# Patient Record
Sex: Female | Born: 2005 | Race: White | Hispanic: No | Marital: Single | Smoking: Current every day smoker
Health system: Southern US, Community
[De-identification: ages and names within clinical notes are randomized; demographics above are authoritative.]

## PROBLEM LIST (undated history)

## (undated) DIAGNOSIS — T7840XA Allergy, unspecified, initial encounter: Secondary | ICD-10-CM

## (undated) DIAGNOSIS — F329 Major depressive disorder, single episode, unspecified: Secondary | ICD-10-CM

## (undated) DIAGNOSIS — F32A Depression, unspecified: Secondary | ICD-10-CM

## (undated) DIAGNOSIS — E739 Lactose intolerance, unspecified: Secondary | ICD-10-CM

## (undated) DIAGNOSIS — F419 Anxiety disorder, unspecified: Secondary | ICD-10-CM

## (undated) DIAGNOSIS — F909 Attention-deficit hyperactivity disorder, unspecified type: Secondary | ICD-10-CM

---
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
## 2024-01-06 ENCOUNTER — Encounter (HOSPITAL_COMMUNITY): Payer: Self-pay

## 2024-01-06 ENCOUNTER — Emergency Department (HOSPITAL_COMMUNITY)
Admission: EM | Admit: 2024-01-06 | Discharge: 2024-01-06 | Disposition: A | Discharge status: Home / Self Care | Payer: MEDICAID | Attending: Emergency Medicine | Admitting: Emergency Medicine

## 2024-01-06 ENCOUNTER — Emergency Department (HOSPITAL_COMMUNITY): Payer: MEDICAID

## 2024-01-06 ENCOUNTER — Other Ambulatory Visit: Payer: Self-pay

## 2024-01-06 DIAGNOSIS — R0602 Shortness of breath: Secondary | ICD-10-CM | POA: Insufficient documentation

## 2024-01-06 DIAGNOSIS — R6884 Jaw pain: Secondary | ICD-10-CM | POA: Diagnosis present

## 2024-01-06 DIAGNOSIS — K047 Periapical abscess without sinus: Secondary | ICD-10-CM | POA: Insufficient documentation

## 2024-01-06 LAB — COMPREHENSIVE METABOLIC PANEL WITH GFR
ALT: 14 U/L (ref 0–44)
AST: 23 U/L (ref 15–41)
Albumin: 4.5 g/dL (ref 3.5–5.0)
Alkaline Phosphatase: 52 U/L (ref 38–126)
Anion gap: 12 (ref 5–15)
BUN: 7 mg/dL (ref 6–20)
CO2: 23 mmol/L (ref 22–32)
Calcium: 9.6 mg/dL (ref 8.9–10.3)
Chloride: 103 mmol/L (ref 98–111)
Creatinine, Ser: 0.72 mg/dL (ref 0.44–1.00)
GFR, Estimated: >60 mL/min (ref >60)
Glucose, Bld: 111 mg/dL — ABNORMAL HIGH (ref 70–99)
Potassium: 3.7 mmol/L (ref 3.5–5.1)
Sodium: 138 mmol/L (ref 135–145)
Total Bilirubin: 0.3 mg/dL (ref 0.0–1.2)
Total Protein: 7.4 g/dL (ref 6.5–8.1)

## 2024-01-06 LAB — CBC WITH DIFFERENTIAL/PLATELET
Abs Immature Granulocytes: 0.02 K/uL (ref 0.00–0.07)
Basophils Absolute: 0.1 K/uL (ref 0.0–0.1)
Basophils Relative: 1 %
Eosinophils Absolute: 0.1 K/uL (ref 0.0–0.5)
Eosinophils Relative: 2 %
HCT: 40.4 % (ref 36.0–46.0)
Hemoglobin: 13.7 g/dL (ref 12.0–15.0)
Immature Granulocytes: 0 %
Lymphocytes Relative: 36 %
Lymphs Abs: 2.5 K/uL (ref 0.7–4.0)
MCH: 28 pg (ref 26.0–34.0)
MCHC: 33.9 g/dL (ref 30.0–36.0)
MCV: 82.4 fL (ref 80.0–100.0)
Monocytes Absolute: 0.7 K/uL (ref 0.1–1.0)
Monocytes Relative: 9 %
Neutro Abs: 3.7 K/uL (ref 1.7–7.7)
Neutrophils Relative %: 52 %
Platelets: 257 K/uL (ref 150–400)
RBC: 4.9 MIL/uL (ref 3.87–5.11)
RDW: 12.9 % (ref 11.5–15.5)
WBC: 7.2 K/uL (ref 4.0–10.5)
nRBC: 0 % (ref 0.0–0.2)

## 2024-01-06 LAB — HCG, SERUM, QUALITATIVE: Preg, Serum: NEGATIVE

## 2024-01-06 MED ORDER — AMOXICILLIN-POT CLAVULANATE 875-125 MG PO TABS
1.0000 | ORAL_TABLET | Freq: Once | ORAL | Status: AC
  Administered 2024-01-06: 1 via ORAL
  Filled 2024-01-06: qty 1

## 2024-01-06 MED ORDER — AMOXICILLIN-POT CLAVULANATE 875-125 MG PO TABS: 1.0000 | ORAL_TABLET | Freq: Two times a day (BID) | ORAL | 0 refills | Status: AC
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
## 2024-01-06 NOTE — ED Triage Notes (Signed)
 Pt arrived from home via POV c/o jaw pain 10/10 that began approx a week ago. Pt also states that she is SOB.

## 2024-01-06 NOTE — Discharge Instructions (Addendum)

## 2024-01-08 ENCOUNTER — Telehealth (HOSPITAL_COMMUNITY): Payer: Self-pay

## 2024-01-08 NOTE — Telephone Encounter (Signed)
 Pt. Called an left a VM for a return call.  Attempted to contact pt. No answer, HIPAA compliant message left for  a return call if needed.

## 2024-01-24 ENCOUNTER — Other Ambulatory Visit: Payer: Self-pay

## 2024-01-24 ENCOUNTER — Ambulatory Visit (HOSPITAL_COMMUNITY)
Admission: EM | Admit: 2024-01-24 | Discharge: 2024-01-24 | Disposition: A | Discharge status: Other Acute Inpatient Hospital | Payer: MEDICAID | Attending: Psychiatry | Admitting: Psychiatry

## 2024-01-24 ENCOUNTER — Encounter (HOSPITAL_COMMUNITY): Payer: Self-pay

## 2024-01-24 ENCOUNTER — Encounter (HOSPITAL_COMMUNITY): Payer: Self-pay | Admitting: *Deleted

## 2024-01-24 ENCOUNTER — Inpatient Hospital Stay (HOSPITAL_COMMUNITY)
Admission: AD | Admit: 2024-01-24 | Discharge: 2024-01-28 | DRG: 883 | Disposition: A | Discharge status: Home / Self Care | Payer: MEDICAID | Source: Intra-hospital | Attending: Psychiatry | Admitting: Psychiatry

## 2024-01-24 DIAGNOSIS — F419 Anxiety disorder, unspecified: Secondary | ICD-10-CM | POA: Insufficient documentation

## 2024-01-24 DIAGNOSIS — F603 Borderline personality disorder: Secondary | ICD-10-CM

## 2024-01-24 DIAGNOSIS — F1210 Cannabis abuse, uncomplicated: Secondary | ICD-10-CM | POA: Insufficient documentation

## 2024-01-24 DIAGNOSIS — Z5982 Transportation insecurity: Secondary | ICD-10-CM | POA: Diagnosis not present

## 2024-01-24 DIAGNOSIS — F29 Unspecified psychosis not due to a substance or known physiological condition: Secondary | ICD-10-CM | POA: Insufficient documentation

## 2024-01-24 DIAGNOSIS — Z9152 Personal history of nonsuicidal self-harm: Secondary | ICD-10-CM | POA: Diagnosis not present

## 2024-01-24 DIAGNOSIS — Z8261 Family history of arthritis: Secondary | ICD-10-CM | POA: Diagnosis not present

## 2024-01-24 DIAGNOSIS — Z818 Family history of other mental and behavioral disorders: Secondary | ICD-10-CM

## 2024-01-24 DIAGNOSIS — F17210 Nicotine dependence, cigarettes, uncomplicated: Secondary | ICD-10-CM | POA: Diagnosis present

## 2024-01-24 DIAGNOSIS — E739 Lactose intolerance, unspecified: Secondary | ICD-10-CM | POA: Diagnosis present

## 2024-01-24 DIAGNOSIS — F259 Schizoaffective disorder, unspecified: Secondary | ICD-10-CM | POA: Insufficient documentation

## 2024-01-24 DIAGNOSIS — Z62810 Personal history of physical and sexual abuse in childhood: Secondary | ICD-10-CM | POA: Diagnosis not present

## 2024-01-24 DIAGNOSIS — R45850 Homicidal ideations: Secondary | ICD-10-CM | POA: Insufficient documentation

## 2024-01-24 DIAGNOSIS — F22 Delusional disorders: Secondary | ICD-10-CM | POA: Diagnosis not present

## 2024-01-24 DIAGNOSIS — F333 Major depressive disorder, recurrent, severe with psychotic symptoms: Secondary | ICD-10-CM | POA: Diagnosis present

## 2024-01-24 DIAGNOSIS — Z5986 Financial insecurity: Secondary | ICD-10-CM | POA: Diagnosis not present

## 2024-01-24 DIAGNOSIS — Z79899 Other long term (current) drug therapy: Secondary | ICD-10-CM

## 2024-01-24 DIAGNOSIS — Z5941 Food insecurity: Secondary | ICD-10-CM

## 2024-01-24 DIAGNOSIS — Z91013 Allergy to seafood: Secondary | ICD-10-CM

## 2024-01-24 DIAGNOSIS — F4481 Dissociative identity disorder: Secondary | ICD-10-CM | POA: Insufficient documentation

## 2024-01-24 DIAGNOSIS — F640 Transsexualism: Secondary | ICD-10-CM | POA: Insufficient documentation

## 2024-01-24 DIAGNOSIS — Q796 Ehlers-Danlos syndrome, unspecified: Secondary | ICD-10-CM

## 2024-01-24 DIAGNOSIS — Z6221 Child in welfare custody: Secondary | ICD-10-CM

## 2024-01-24 DIAGNOSIS — F909 Attention-deficit hyperactivity disorder, unspecified type: Secondary | ICD-10-CM | POA: Insufficient documentation

## 2024-01-24 DIAGNOSIS — F411 Generalized anxiety disorder: Secondary | ICD-10-CM | POA: Diagnosis present

## 2024-01-24 DIAGNOSIS — R45851 Suicidal ideations: Secondary | ICD-10-CM | POA: Diagnosis present

## 2024-01-24 DIAGNOSIS — Z8269 Family history of other diseases of the musculoskeletal system and connective tissue: Secondary | ICD-10-CM

## 2024-01-24 DIAGNOSIS — F4310 Post-traumatic stress disorder, unspecified: Secondary | ICD-10-CM | POA: Insufficient documentation

## 2024-01-24 DIAGNOSIS — F332 Major depressive disorder, recurrent severe without psychotic features: Secondary | ICD-10-CM | POA: Diagnosis not present

## 2024-01-24 DIAGNOSIS — F429 Obsessive-compulsive disorder, unspecified: Secondary | ICD-10-CM | POA: Insufficient documentation

## 2024-01-24 DIAGNOSIS — R9431 Abnormal electrocardiogram [ECG] [EKG]: Secondary | ICD-10-CM | POA: Insufficient documentation

## 2024-01-24 DIAGNOSIS — I4510 Unspecified right bundle-branch block: Secondary | ICD-10-CM | POA: Diagnosis present

## 2024-01-24 DIAGNOSIS — G4700 Insomnia, unspecified: Secondary | ICD-10-CM | POA: Diagnosis present

## 2024-01-24 DIAGNOSIS — Z9151 Personal history of suicidal behavior: Secondary | ICD-10-CM | POA: Diagnosis not present

## 2024-01-24 DIAGNOSIS — F1910 Other psychoactive substance abuse, uncomplicated: Secondary | ICD-10-CM

## 2024-01-24 DIAGNOSIS — F329 Major depressive disorder, single episode, unspecified: Principal | ICD-10-CM | POA: Diagnosis present

## 2024-01-24 DIAGNOSIS — Z91148 Patient's other noncompliance with medication regimen for other reason: Secondary | ICD-10-CM

## 2024-01-24 LAB — TSH: TSH: 1.959 u[IU]/mL (ref 0.350–4.500)

## 2024-01-24 LAB — CBC WITH DIFFERENTIAL/PLATELET
Abs Immature Granulocytes: 0.02 K/uL (ref 0.00–0.07)
Basophils Absolute: 0.1 K/uL (ref 0.0–0.1)
Basophils Relative: 1 %
Eosinophils Absolute: 0 K/uL (ref 0.0–0.5)
Eosinophils Relative: 0 %
HCT: 39.5 % (ref 36.0–46.0)
Hemoglobin: 13 g/dL (ref 12.0–15.0)
Immature Granulocytes: 0 %
Lymphocytes Relative: 25 %
Lymphs Abs: 1.9 K/uL (ref 0.7–4.0)
MCH: 27.7 pg (ref 26.0–34.0)
MCHC: 32.9 g/dL (ref 30.0–36.0)
MCV: 84 fL (ref 80.0–100.0)
Monocytes Absolute: 0.5 K/uL (ref 0.1–1.0)
Monocytes Relative: 7 %
Neutro Abs: 5.1 K/uL (ref 1.7–7.7)
Neutrophils Relative %: 67 %
Platelets: 213 K/uL (ref 150–400)
RBC: 4.7 MIL/uL (ref 3.87–5.11)
RDW: 12.7 % (ref 11.5–15.5)
WBC: 7.7 K/uL (ref 4.0–10.5)
nRBC: 0 % (ref 0.0–0.2)

## 2024-01-24 LAB — POCT URINE DRUG SCREEN - MANUAL ENTRY (I-SCREEN)
POC Amphetamine UR: NOT DETECTED
POC Buprenorphine (BUP): NOT DETECTED
POC Cocaine UR: NOT DETECTED
POC Marijuana UR: NOT DETECTED
POC Methadone UR: NOT DETECTED
POC Methamphetamine UR: NOT DETECTED
POC Morphine: NOT DETECTED
POC Oxazepam (BZO): NOT DETECTED
POC Oxycodone UR: NOT DETECTED
POC Secobarbital (BAR): NOT DETECTED

## 2024-01-24 LAB — LIPID PANEL
Cholesterol: 144 mg/dL (ref 0–169)
HDL: 59 mg/dL (ref >40)
LDL Cholesterol: 73 mg/dL (ref 0–99)
Total CHOL/HDL Ratio: 2.4 ratio
Triglycerides: 58 mg/dL (ref <150)
VLDL: 12 mg/dL (ref 0–40)

## 2024-01-24 LAB — COMPREHENSIVE METABOLIC PANEL WITH GFR
ALT: 14 U/L (ref 0–44)
AST: 23 U/L (ref 15–41)
Albumin: 4.5 g/dL (ref 3.5–5.0)
Alkaline Phosphatase: 59 U/L (ref 38–126)
Anion gap: 10 (ref 5–15)
BUN: 12 mg/dL (ref 6–20)
CO2: 26 mmol/L (ref 22–32)
Calcium: 9.8 mg/dL (ref 8.9–10.3)
Chloride: 104 mmol/L (ref 98–111)
Creatinine, Ser: 0.68 mg/dL (ref 0.44–1.00)
GFR, Estimated: >60 mL/min (ref >60)
Glucose, Bld: 87 mg/dL (ref 70–99)
Potassium: 3.8 mmol/L (ref 3.5–5.1)
Sodium: 140 mmol/L (ref 135–145)
Total Bilirubin: 0.5 mg/dL (ref 0.0–1.2)
Total Protein: 7.3 g/dL (ref 6.5–8.1)

## 2024-01-24 LAB — HEMOGLOBIN A1C
Hgb A1c MFr Bld: 4.6 % — ABNORMAL LOW (ref 4.8–5.6)
Mean Plasma Glucose: 85.32 mg/dL

## 2024-01-24 LAB — POC URINE PREG, ED: Preg Test, Ur: NEGATIVE

## 2024-01-24 MED ORDER — HALOPERIDOL LACTATE 5 MG/ML IJ SOLN: 10.0000 mg | Freq: Three times a day (TID) | INTRAMUSCULAR | Status: DC | PRN

## 2024-01-24 MED ORDER — LORAZEPAM 2 MG/ML IJ SOLN: 2.0000 mg | Freq: Three times a day (TID) | INTRAMUSCULAR | Status: DC | PRN

## 2024-01-24 MED ORDER — ACETAMINOPHEN 325 MG PO TABS: 650.0000 mg | ORAL_TABLET | Freq: Four times a day (QID) | ORAL | Status: DC | PRN

## 2024-01-24 MED ORDER — DIPHENHYDRAMINE HCL 50 MG/ML IJ SOLN: 50.0000 mg | Freq: Three times a day (TID) | INTRAMUSCULAR | Status: DC | PRN

## 2024-01-24 MED ORDER — HYDROXYZINE HCL 25 MG PO TABS: 25.0000 mg | ORAL_TABLET | Freq: Three times a day (TID) | ORAL | Status: DC | PRN

## 2024-01-24 MED ORDER — HALOPERIDOL LACTATE 5 MG/ML IJ SOLN: 5.0000 mg | Freq: Three times a day (TID) | INTRAMUSCULAR | Status: DC | PRN

## 2024-01-24 MED ORDER — HALOPERIDOL 5 MG PO TABS: 5.0000 mg | ORAL_TABLET | Freq: Three times a day (TID) | ORAL | Status: DC | PRN

## 2024-01-24 MED ORDER — TRAZODONE HCL 50 MG PO TABS: 50.0000 mg | ORAL_TABLET | Freq: Every evening | ORAL | Status: DC | PRN

## 2024-01-24 MED ORDER — TRAZODONE HCL 50 MG PO TABS
50.0000 mg | ORAL_TABLET | Freq: Every evening | ORAL | Status: DC | PRN
  Administered 2024-01-24: 50 mg via ORAL
  Filled 2024-01-24: qty 1

## 2024-01-24 MED ORDER — MAGNESIUM HYDROXIDE 400 MG/5ML PO SUSP: 30.0000 mL | Freq: Every day | ORAL | Status: DC | PRN

## 2024-01-24 MED ORDER — ALUM & MAG HYDROXIDE-SIMETH 200-200-20 MG/5ML PO SUSP: 30.0000 mL | ORAL | Status: DC | PRN

## 2024-01-24 MED ORDER — OLANZAPINE 5 MG PO TBDP: 5.0000 mg | ORAL_TABLET | Freq: Three times a day (TID) | ORAL | Status: DC | PRN

## 2024-01-24 MED ORDER — DIPHENHYDRAMINE HCL 50 MG PO CAPS: 50.0000 mg | ORAL_CAPSULE | Freq: Three times a day (TID) | ORAL | Status: DC | PRN

## 2024-01-24 MED ORDER — OLANZAPINE 5 MG PO TABS
5.0000 mg | ORAL_TABLET | Freq: Every day | ORAL | Status: DC
  Administered 2024-01-24: 5 mg via ORAL
  Filled 2024-01-24: qty 1

## 2024-01-24 NOTE — ED Notes (Signed)
 Pt signed e consent
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
## 2024-01-24 NOTE — ED Provider Notes (Signed)
 [facility] Continuous Assessment Admission H&P  Date: 01/24/24 Patient Name: Evelyn Owens MRN: [id number] Chief Complaint:  I was thinking suicide is the best option.   Diagnoses:  Final diagnoses:  Psychosis, unspecified psychosis type (HCC)  Dissociative identity disorder (HCC)  Suicidal ideation  Homicidal ideation  Substance abuse (HCC)  Schizoaffective disorder, unspecified type (HCC)    HPI: Evelyn Owens is an 18 year old female, who identifies as they/them pronouns, with a past psychiatric history significant for MDD with psychotic features, dissociative identity disorder, gender dysphoria, ADHD and anxiety who presented voluntarily to the [facility] accompanied by GPD with suicidal ideations with a plan, HI and bizarre behaviors.   Patient was seen face to face by this provider and chart reviewed.  Patient presents as very inattentive and disorganized with rapid speech, racing thoughts and bizarre mannerisms. Speech is pressured, tangential and mostly nonsensical. Pt presents as very anxious and restless. Patient is observed muttering under her breath at times. She has difficulty focusing and is difficult to understand.  Patient was asked about the circumstances that led to her [facility] visit today and she reports I was about to take myself out and take them out, I was f........g mad, I was thinking suicide is the best option, stab myself, shoot myself, I have no clue how I got here.  Patient reports she smokes marijuana.  She lives with her dad and she is trying to get back in school. Patient endorses voices in my head but not out.  She reports the voices are telling her to relax, chill, this is normal, she denies command hallucinations.   Unable to continue further evaluation at this time due to patient's mental status.   On evaluation, patient is alert, oriented x 1, and cooperative. Speech is pressured and increased. Pt appear casually dressed. Eye  contact is poor. Mood is anxious, affect is congruent with mood. Thought process is disorganized, tangential and thought content is scattered. Pt endorses SI,and HI, denies AVH. There is no objective indication at this time, that the patient is responding to internal stimuli. No delusions elicited during this assessment.    Discussed recommendation for inpatient psychiatric admission for stabilization and treatment.  Patient is provided with opportunity for questions.  She verbalized understanding and is in agreement.  Total Time spent with patient: 30 minutes  Musculoskeletal  Strength & Muscle Tone: within normal limits Gait & Station: normal Patient leans: N/A  Psychiatric Specialty Exam  Presentation General Appearance:  Casual  Eye Contact: Poor  Speech: Pressured  Speech Volume: Increased  Handedness: Right   Mood and Affect  Mood: Anxious  Affect: Congruent   Thought Process  Thought Processes: Disorganized  Descriptions of Associations:Tangential  Orientation:Other (comment) (UTA)  Thought Content:Scattered; Tangential  Diagnosis of Schizophrenia or Schizoaffective disorder in past: No  Duration of Psychotic Symptoms: Less than six months  Hallucinations:Hallucinations: Auditory Description of Auditory Hallucinations: Pt reports voices in my head, talking constantly.  Ideas of Reference:None  Suicidal Thoughts:Suicidal Thoughts: Yes, Active SI Active Intent and/or Plan: With Plan  Homicidal Thoughts:Homicidal Thoughts: Yes, Passive HI Passive Intent and/or Plan: Without Plan   Sensorium  Memory: Immediate Poor  Judgment: Poor  Insight: Lacking   Executive Functions  Concentration: Poor  Attention Span: Poor  Recall: Poor  Fund of Knowledge: Poor  Language: Poor   Psychomotor Activity  Psychomotor Activity: Psychomotor Activity: Mannerisms   Assets  Assets: Communication Skills; Desire for Improvement   Sleep   Sleep: Sleep: Poor  Nutritional Assessment (For OBS and [facility] admissions only) Has the patient had a weight loss or gain of 10 pounds or more in the last 3 months?: No Has the patient had a decrease in food intake/or appetite?: No Does the patient have dental problems?: No Does the patient have eating habits or behaviors that may be indicators of an eating disorder including binging or inducing vomiting?: No Has the patient recently lost weight without trying?: 0 Has the patient been eating poorly because of a decreased appetite?: 0 Malnutrition Screening Tool Score: 0    Physical Exam Constitutional:      Appearance: She is not ill-appearing or diaphoretic.  HENT:     Nose: No congestion.  Pulmonary:     Effort: No respiratory distress.  Neurological:     General: No focal deficit present.     Mental Status: She is alert.  Psychiatric:        Attention and Perception: She is inattentive. She perceives auditory hallucinations.        Mood and Affect: Mood is anxious. Affect is inappropriate.        Speech: Speech is rapid and pressured.        Behavior: Behavior is actively hallucinating. Behavior is cooperative.        Thought Content: Thought content includes homicidal and suicidal ideation. Thought content includes suicidal plan.    Review of Systems  Constitutional:  Negative for chills, diaphoresis and fever.  HENT:  Negative for congestion.   Eyes:  Negative for discharge.  Respiratory:  Negative for cough, shortness of breath and wheezing.   Cardiovascular:  Negative for chest pain and palpitations.  Gastrointestinal:  Negative for diarrhea, nausea and vomiting.  Neurological:  Negative for dizziness, seizures, weakness and headaches.  Psychiatric/Behavioral:  Positive for hallucinations and suicidal ideas. The patient is nervous/anxious.     Blood pressure (!) 125/93, pulse (!) 101, temperature 99.8 F (37.7 C), temperature source Oral, resp. rate 20, last  menstrual period 01/06/2024, SpO2 97%. There is no height or weight on file to calculate BMI.  Past Psychiatric History: See H & P   Is the patient at risk to self? Yes  Has the patient been a risk to self in the past 6 months? Yes .    Has the patient been a risk to self within the distant past? Yes   Is the patient a risk to others? Yes   Has the patient been a risk to others in the past 6 months? unknown  Has the patient been a risk to others within the distant past? unknown  Past Medical History: See Chart  Family History: N/A  Social History: N/A  Last Labs:  Admission on 01/24/2024  Component Date Value Ref Range Status   Preg Test, Ur 01/24/2024 Negative  Negative Final   POC Amphetamine UR 01/24/2024 None Detected  NONE DETECTED (Cut Off Level 1000 ng/mL) Final   POC Secobarbital (BAR) 01/24/2024 None Detected  NONE DETECTED (Cut Off Level 300 ng/mL) Final   POC Buprenorphine (BUP) 01/24/2024 None Detected  NONE DETECTED (Cut Off Level 10 ng/mL) Final   POC Oxazepam (BZO) 01/24/2024 None Detected  NONE DETECTED (Cut Off Level 300 ng/mL) Final   POC Cocaine UR 01/24/2024 None Detected  NONE DETECTED (Cut Off Level 300 ng/mL) Final   POC Methamphetamine UR 01/24/2024 None Detected  NONE DETECTED (Cut Off Level 1000 ng/mL) Final   POC Morphine 01/24/2024 None Detected  NONE DETECTED (Cut Off Level 300  ng/mL) Final   POC Methadone UR 01/24/2024 None Detected  NONE DETECTED (Cut Off Level 300 ng/mL) Final   POC Oxycodone UR 01/24/2024 None Detected  NONE DETECTED (Cut Off Level 100 ng/mL) Final   POC Marijuana UR 01/24/2024 None Detected  NONE DETECTED (Cut Off Level 50 ng/mL) Final  Admission on 01/06/2024, Discharged on 01/06/2024  Component Date Value Ref Range Status   Preg, Serum 01/06/2024 NEGATIVE  NEGATIVE Final   Comment:        THE SENSITIVITY OF THIS METHODOLOGY IS >10 mIU/mL. Performed at [facility], [address], [place], [place] [address]    WBC  01/06/2024 7.2  4.0 - 10.5 K/uL Final   RBC 01/06/2024 4.90  3.87 - 5.11 MIL/uL Final   Hemoglobin 01/06/2024 13.7  12.0 - 15.0 g/dL Final   HCT 92/92/7974 40.4  36.0 - 46.0 % Final   MCV 01/06/2024 82.4  80.0 - 100.0 fL Final   MCH 01/06/2024 28.0  26.0 - 34.0 pg Final   MCHC 01/06/2024 33.9  30.0 - 36.0 g/dL Final   RDW 92/92/7974 12.9  11.5 - 15.5 % Final   Platelets 01/06/2024 257  150 - 400 K/uL Final   nRBC 01/06/2024 0.0  0.0 - 0.2 % Final   Neutrophils Relative % 01/06/2024 52  % Final   Neutro Abs 01/06/2024 3.7  1.7 - 7.7 K/uL Final   Lymphocytes Relative 01/06/2024 36  % Final   Lymphs Abs 01/06/2024 2.5  0.7 - 4.0 K/uL Final   Monocytes Relative 01/06/2024 9  % Final   Monocytes Absolute 01/06/2024 0.7  0.1 - 1.0 K/uL Final   Eosinophils Relative 01/06/2024 2  % Final   Eosinophils Absolute 01/06/2024 0.1  0.0 - 0.5 K/uL Final   Basophils Relative 01/06/2024 1  % Final   Basophils Absolute 01/06/2024 0.1  0.0 - 0.1 K/uL Final   Immature Granulocytes 01/06/2024 0  % Final   Abs Immature Granulocytes 01/06/2024 0.02  0.00 - 0.07 K/uL Final   Performed at [facility], [address], [place], [place] [address]   Sodium 01/06/2024 138  135 - 145 mmol/L Final   Potassium 01/06/2024 3.7  3.5 - 5.1 mmol/L Final   Chloride 01/06/2024 103  98 - 111 mmol/L Final   CO2 01/06/2024 23  22 - 32 mmol/L Final   Glucose, Bld 01/06/2024 111 (H)  70 - 99 mg/dL Final   Glucose reference range applies only to samples taken after fasting for at least 8 hours.   BUN 01/06/2024 7  6 - 20 mg/dL Final   Creatinine, Ser 01/06/2024 0.72  0.44 - 1.00 mg/dL Final   Calcium 92/92/7974 9.6  8.9 - 10.3 mg/dL Final   Total Protein 92/92/7974 7.4  6.5 - 8.1 g/dL Final   Albumin 92/92/7974 4.5  3.5 - 5.0 g/dL Final   AST 92/92/7974 23  15 - 41 U/L Final   ALT 01/06/2024 14  0 - 44 U/L Final   Alkaline Phosphatase 01/06/2024 52  38 - 126 U/L Final   Total Bilirubin 01/06/2024 0.3  0.0 - 1.2 mg/dL  Final   GFR, Estimated 01/06/2024 >60  >60 mL/min Final   Comment: (NOTE) Calculated using the CKD-EPI Creatinine Equation (2021)    Anion gap 01/06/2024 12  5 - 15 Final   Performed at [facility], [address], [place], [place] [address]  Admission on 10/08/2023, Discharged on 10/09/2023  Component Date Value Ref  Range Status   WBC 10/09/2023 8.7  4.0 - 10.5 K/uL Final   RBC 10/09/2023 4.95  3.87 - 5.11 MIL/uL Final   Hemoglobin 10/09/2023 13.9  12.0 - 15.0 g/dL Final   HCT 95/90/7974 41.8  36.0 - 46.0 % Final   MCV 10/09/2023 84.4  80.0 - 100.0 fL Final   MCH 10/09/2023 28.1  26.0 - 34.0 pg Final   MCHC 10/09/2023 33.3  30.0 - 36.0 g/dL Final   RDW 95/90/7974 12.6  11.5 - 15.5 % Final   Platelets 10/09/2023 276  150 - 400 K/uL Final   nRBC 10/09/2023 0.0  0.0 - 0.2 % Final   Performed at [facility], [address] [place], [place] [address]   Sodium 10/09/2023 139  135 - 145 mmol/L Final   Potassium 10/09/2023 4.3  3.5 - 5.1 mmol/L Final   Chloride 10/09/2023 104  98 - 111 mmol/L Final   CO2 10/09/2023 26  22 - 32 mmol/L Final   Glucose, Bld 10/09/2023 89  70 - 99 mg/dL Final   Glucose reference range applies only to samples taken after fasting for at least 8 hours.   BUN 10/09/2023 11  6 - 20 mg/dL Final   Creatinine, Ser 10/09/2023 0.76  0.44 - 1.00 mg/dL Final   Calcium 95/90/7974 9.8  8.9 - 10.3 mg/dL Final   GFR, Estimated 10/09/2023 >60  >60 mL/min Final   Comment: (NOTE) Calculated using the CKD-EPI Creatinine Equation (2021)    Anion gap 10/09/2023 9  5 - 15 Final   Performed at [facility], [address] [place], [place] [address]    Allergies: Milk-related compounds and Shellfish allergy  Medications:  Facility Ordered Medications  Medication   acetaminophen  (TYLENOL ) tablet 650 mg   alum & mag hydroxide-simeth (MAALOX/MYLANTA) 200-200-20 MG/5ML suspension 30 mL   magnesium  hydroxide (MILK OF MAGNESIA)  suspension 30 mL   haloperidol (HALDOL) tablet 5 mg   And   diphenhydrAMINE  (BENADRYL ) capsule 50 mg   haloperidol lactate (HALDOL) injection 5 mg   And   diphenhydrAMINE  (BENADRYL ) injection 50 mg   And   LORazepam  (ATIVAN ) injection 2 mg   haloperidol lactate (HALDOL) injection 10 mg   And   diphenhydrAMINE  (BENADRYL ) injection 50 mg   And   LORazepam  (ATIVAN ) injection 2 mg   hydrOXYzine  (ATARAX ) tablet 25 mg   traZODone (DESYREL) tablet 50 mg   OLANZapine  (ZYPREXA ) tablet 5 mg   PTA Medications  Medication Sig   Drospirenone  (SLYND ) 4 MG TABS Take 1 tablet (4 mg total) by mouth daily.   lidocaine  (LIDODERM ) 5 % Place 2 patches onto the skin daily. Remove & Discard patch within 12 hours or as directed by MD (Patient not taking: Reported on 10/09/2023)   sertraline  (ZOLOFT ) 50 MG tablet Take 1 tablet (50 mg total) by mouth daily.   ibuprofen  (ADVIL ) 200 MG tablet Take 200 mg by mouth every 6 (six) hours as needed for moderate pain (pain score 4-6), headache or mild pain (pain score 1-3).      Medical Decision Making  Recommend inpatient psychiatric admission for stabilization and treatment.  Patient presents as very disorganized with pressured and tangential speech and bizarre mannerisms.  She is endorsing SI with a plan and HI no plan.  She is also endorsing auditory hallucinations of several voices in her head, controlling her. Patient is a danger to herself and will benefit from inpatient psychiatric hospitalization. Patient will be admitted to  the continuous observation unit for safety monitoring pending transfer to an inpatient psychiatric unit. LCSW will seek bed placement.   Lab Orders         CBC with Differential/Platelet         Comprehensive metabolic panel         Hemoglobin A1c         Lipid panel         TSH         POC urine preg, ED         POCT Urine Drug Screen - (I-Screen)     EKG  Medication started -Zyprexa  5 mg p.o. daily at bedtime for  AH/Psychosis/Mood  Other PRN meds -Trazodone, MOM, Atarax , Maalox, Tylenol  -As needed agitation protocol medications   Recommendations  Based on my evaluation the patient does not appear to have an emergency medical condition.  Recommend inpatient psychiatric admission for stabilization and treatment.  Thurman LULLA Ivans, NP 01/24/24  3:11 AM

## 2024-01-24 NOTE — ED Notes (Signed)
 Pt woke and was hungry. Pt provided with sandwich.chips and juice.  Pt stated. Did I miss transport? Referring to  transfer to [facility].  Assured pt she did not and she will be transported when bed is ready. Pt spoke with dad briefly on phone.  Then laid back in  bed Hourly observations continue for safety

## 2024-01-24 NOTE — ED Provider Notes (Signed)
 Behavioral Health Discharge Summary  Date and Time: 01/24/2024 10:10 AM Name:  Evelyn Owens (goes by Evelyn Owens) Pronouns: they/them per chart review, any [pronouns] are fine this morning on interview MRN:  [id number]  Subjective:    Stay Summary:Evelyn Owens is an 18 year old Transgender female -> female who arrived at [facility] from home with GPD on 7/25 around 1AM voluntarily for SI w/ plan, HI w/o plan, hearing voices, pressured speech, and bizarre behaviors. Psychiatric history includes MDD, DID, Gender dysphoria, ADHD, and anxiety. Medical history includes chlamydia. No reported past psychiatric hospitalizations. In addition to voicing SI w/ plan, also made statements such as I was about to take myself out and take them out, I was f........g mad, I was thinking suicide is the best option, stab myself, shoot myself, I have no clue how I got here, voices in my head but not out. She reports the voices are telling her to relax, chill, this is normal, she denies command hallucinations. I just want know if I'm pregnant or not; I've had chlamydia 4 times now.   During their stay, they required trazodone 50mg  and zyprexa  5mg  at about 3am to sleep, and they were somnolent past 11am the same morning.  01/24/24 Initial interview conducted at 8:30 AM limited by patient participation. Patient reported having taken trazodone and Zyprexa  around 3:00 or 4:00 AM (confirmed 50 mg trazodone, 5 mg Zyprexa  03:12), so they were intermittently somnolent/sleeping. Patient expressed that they felt they were at risk to [themselves] and feeling vulnerable, prompting them to come to [facility] yesterday. They share that they would benefit from being stabilized. They described their mood as normal and neutral. On ROS, they endorsed no active AVH but history of seeing a frightening tall man and hearing overlapping voices talking without commands or distressing content. They denied SI/HI and denied present/historical  manic symptoms (described to the patient as boundless energy and elation without need for sleep for days on end). Patient endorsed being on sertraline  50 mg but it didn't work. Per chart review, patient has been on Abilify  and Zyprexa  in the past.  Collateral from father 01/24/24: Pt exerts lots of independence, drinks 3-4 energy drinks daily, and is pretty anti-drug. Says she is pretty scatter brained, but that she is highly intelligent and she will probably try to work the system to get out. Otherwise, father does not provide much supervision for her 69 year old child and did not think that her fast speech was abnormal for her. No clear psychosis/mania witnessed by father.  Follow-up interview attempted at 10:30 AM. Patient was still sleeping. They were arousable to name with light knee touch but were not arousable to name only. They immediately fell asleep during questioning.  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 schizoaffective disorder). Social History: Lives with father and aunt and describes their dynamic as interesting. Reports feeling safe at home. Enjoys drawing and listening to music. Shares that their family would describe them as caring.     Final diagnoses:  Psychosis, unspecified psychosis type [facility])  Dissociative  identity disorder (HCC)  Suicidal ideation  Homicidal ideation  Substance abuse (HCC)   Schizoaffective disorder, unspecified type (HCC)    Additional Social History:    Pain Medications: Please see MAR Prescriptions: Please see MAR Over the Counter: Please see MAR History of alcohol / drug use?: No history of alcohol / drug abuse Longest period of sobriety (when/how long): reports THC use, denies abuse Negative Consequences of Use:  (n/a) Withdrawal Symptoms:  (n/a)                    Sleep: Good  Appetite:  NA  Current Medications:  Current Facility-Administered Medications  Medication Dose Route Frequency Provider Last Rate Last Admin   acetaminophen  (TYLENOL ) tablet 650 mg  650 mg Oral Q6H PRN Onuoha, Chinwendu V, NP       alum & mag hydroxide-simeth (MAALOX/MYLANTA) 200-200-20 MG/5ML suspension 30 mL  30 mL Oral Q4H PRN Onuoha, Chinwendu V, NP       haloperidol (HALDOL) tablet 5 mg  5 mg Oral TID PRN Onuoha, Chinwendu V, NP       And   diphenhydrAMINE  (BENADRYL ) capsule 50 mg  50 mg Oral TID PRN Onuoha, Chinwendu V, NP       haloperidol lactate (HALDOL) injection 5 mg  5 mg Intramuscular TID PRN Onuoha, Chinwendu V, NP       And   diphenhydrAMINE  (BENADRYL ) injection 50 mg  50 mg Intramuscular TID PRN Onuoha, Chinwendu V, NP       And   LORazepam  (ATIVAN ) injection 2 mg  2 mg Intramuscular TID PRN Onuoha, Chinwendu V, NP       haloperidol lactate (HALDOL) injection 10 mg  10 mg Intramuscular TID PRN Onuoha, Chinwendu V, NP       And   diphenhydrAMINE  (BENADRYL ) injection 50 mg  50 mg Intramuscular TID PRN Onuoha, Chinwendu V, NP       And   LORazepam  (ATIVAN ) injection 2 mg  2 mg Intramuscular TID PRN Onuoha, Chinwendu V, NP       hydrOXYzine  (ATARAX ) tablet 25 mg  25 mg Oral TID PRN Onuoha, Chinwendu V, NP       magnesium  hydroxide (MILK OF MAGNESIA) suspension 30 mL  30 mL Oral Daily PRN Onuoha, Chinwendu V, NP       OLANZapine  (ZYPREXA ) tablet 5 mg  5 mg Oral QHS Onuoha, Chinwendu V, NP   5 mg at 01/24/24 0312   traZODone (DESYREL) tablet 50 mg   50 mg Oral QHS PRN Onuoha, Chinwendu V, NP   50 mg at 01/24/24 9687   Current Outpatient Medications  Medication Sig Dispense Refill   Drospirenone  (SLYND ) 4 MG TABS Take 1 tablet (4 mg total) by mouth daily. 90 tablet 3   ibuprofen  (ADVIL ) 200 MG tablet Take 200 mg by mouth every 6 (six) hours as needed for moderate pain (pain score 4-6), headache or mild pain (pain score 1-3).     lidocaine  (LIDODERM ) 5 % Place 2 patches onto the skin daily. Remove & Discard patch within 12 hours or as directed by MD (Patient not taking: Reported on 10/09/2023) 60 patch 0   sertraline  (ZOLOFT ) 50 MG tablet Take 1 tablet (50 mg total) by mouth daily. 30 tablet 1    Labs  Lab Results:  Admission on 01/24/2024  Component Date Value Ref Range Status   WBC 01/24/2024 7.7  4.0 - 10.5 K/uL Final   RBC 01/24/2024 4.70  3.87 - 5.11 MIL/uL Final  Hemoglobin 01/24/2024 13.0  12.0 - 15.0 g/dL Final   HCT 92/74/7974 39.5  36.0 - 46.0 % Final   MCV 01/24/2024 84.0  80.0 - 100.0 fL Final   MCH 01/24/2024 27.7  26.0 - 34.0 pg Final   MCHC 01/24/2024 32.9  30.0 - 36.0 g/dL Final   RDW 92/74/7974 12.7  11.5 - 15.5 % Final   Platelets 01/24/2024 213  150 - 400 K/uL Final   nRBC 01/24/2024 0.0  0.0 - 0.2 % Final   Neutrophils Relative % 01/24/2024 67  % Final   Neutro Abs 01/24/2024 5.1  1.7 - 7.7 K/uL Final   Lymphocytes Relative 01/24/2024 25  % Final   Lymphs Abs 01/24/2024 1.9  0.7 - 4.0 K/uL Final   Monocytes Relative 01/24/2024 7  % Final   Monocytes Absolute 01/24/2024 0.5  0.1 - 1.0 K/uL Final   Eosinophils Relative 01/24/2024 0  % Final   Eosinophils Absolute 01/24/2024 0.0  0.0 - 0.5 K/uL Final   Basophils Relative 01/24/2024 1  % Final   Basophils Absolute 01/24/2024 0.1  0.0 - 0.1 K/uL Final   Immature Granulocytes 01/24/2024 0  % Final   Abs Immature Granulocytes 01/24/2024 0.02  0.00 - 0.07 K/uL Final   Performed at [facility], [address] [place], [place] [address]   Sodium 01/24/2024 140   135 - 145 mmol/L Final   Potassium 01/24/2024 3.8  3.5 - 5.1 mmol/L Final   Chloride 01/24/2024 104  98 - 111 mmol/L Final   CO2 01/24/2024 26  22 - 32 mmol/L Final   Glucose, Bld 01/24/2024 87  70 - 99 mg/dL Final   Glucose reference range applies only to samples taken after fasting for at least 8 hours.   BUN 01/24/2024 12  6 - 20 mg/dL Final   Creatinine, Ser 01/24/2024 0.68  0.44 - 1.00 mg/dL Final   Calcium 92/74/7974 9.8  8.9 - 10.3 mg/dL Final   Total Protein 92/74/7974 7.3  6.5 - 8.1 g/dL Final   Albumin 92/74/7974 4.5  3.5 - 5.0 g/dL Final   AST 92/74/7974 23  15 - 41 U/L Final   ALT 01/24/2024 14  0 - 44 U/L Final   Alkaline Phosphatase 01/24/2024 59  38 - 126 U/L Final   Total Bilirubin 01/24/2024 0.5  0.0 - 1.2 mg/dL Final   GFR, Estimated 01/24/2024 >60  >60 mL/min Final   Comment: (NOTE) Calculated using the CKD-EPI Creatinine Equation (2021)    Anion gap 01/24/2024 10  5 - 15 Final   Performed at [facility], [address], [place], [place] [address]   Hgb A1c MFr Bld 01/24/2024 4.6 (L)  4.8 - 5.6 % Final   Comment: (NOTE) Diagnosis of Diabetes The following HbA1c ranges recommended by the American Diabetes Association (ADA) may be used as an aid in the diagnosis of diabetes mellitus.  Hemoglobin             Suggested A1C NGSP%              Diagnosis  <5.7                   Non Diabetic  5.7-6.4                Pre-Diabetic  >6.4                   Diabetic  <7.0  Glycemic control for                       adults with diabetes.     Mean Plasma Glucose 01/24/2024 85.32  mg/dL Final   Performed at [facility], [address] [place], [place] [address]   Cholesterol 01/24/2024 144  0 - 169 mg/dL Final   Triglycerides 92/74/7974 58  <150 mg/dL Final   HDL 92/74/7974 59  >40 mg/dL Final   Total CHOL/HDL Ratio 01/24/2024 2.4  RATIO Final   VLDL 01/24/2024 12  0 - 40 mg/dL Final   LDL Cholesterol 01/24/2024 73  0 - 99 mg/dL Final    Comment:        Total Cholesterol/HDL:CHD Risk Coronary Heart Disease Risk Table                     Men   Women  1/2 Average Risk   3.4   3.3  Average Risk       5.0   4.4  2 X Average Risk   9.6   7.1  3 X Average Risk  23.4   11.0        Use the calculated Patient Ratio above and the CHD Risk Table to determine the patient's CHD Risk.        ATP III CLASSIFICATION (LDL):  <100     mg/dL   Optimal  899-870  mg/dL   Near or Above                    Optimal  130-159  mg/dL   Borderline  839-810  mg/dL   High  >809     mg/dL   Very High Performed at [facility], [address] [place], [place] [address]    Preg Test, Ur 01/24/2024 Negative  Negative Final   POC Amphetamine UR 01/24/2024 None Detected  NONE DETECTED (Cut Off Level 1000 ng/mL) Final   POC Secobarbital (BAR) 01/24/2024 None Detected  NONE DETECTED (Cut Off Level 300 ng/mL) Final   POC Buprenorphine (BUP) 01/24/2024 None Detected  NONE DETECTED (Cut Off Level 10 ng/mL) Final   POC Oxazepam (BZO) 01/24/2024 None Detected  NONE DETECTED (Cut Off Level 300 ng/mL) Final   POC Cocaine UR 01/24/2024 None Detected  NONE DETECTED (Cut Off Level 300 ng/mL) Final   POC Methamphetamine UR 01/24/2024 None Detected  NONE DETECTED (Cut Off Level 1000 ng/mL) Final   POC Morphine 01/24/2024 None Detected  NONE DETECTED (Cut Off Level 300 ng/mL) Final   POC Methadone UR 01/24/2024 None Detected  NONE DETECTED (Cut Off Level 300 ng/mL) Final   POC Oxycodone UR 01/24/2024 None Detected  NONE DETECTED (Cut Off Level 100 ng/mL) Final   POC Marijuana UR 01/24/2024 None Detected  NONE DETECTED (Cut Off Level 50 ng/mL) Final   TSH 01/24/2024 1.959  0.350 - 4.500 uIU/mL Final   Comment: Performed by a 3rd Generation assay with a functional sensitivity of <=0.01 uIU/mL. Performed at [facility], [address], [place], [place] [address]   Admission on 01/06/2024, Discharged on 01/06/2024  Component Date Value Ref Range  Status   Preg, Serum 01/06/2024 NEGATIVE  NEGATIVE Final   Comment:        THE SENSITIVITY OF THIS METHODOLOGY IS >10 mIU/mL. Performed at [facility], [address], [place], [place] [address]    WBC 01/06/2024 7.2  4.0 -  10.5 K/uL Final   RBC 01/06/2024 4.90  3.87 - 5.11 MIL/uL Final   Hemoglobin 01/06/2024 13.7  12.0 - 15.0 g/dL Final   HCT 92/92/7974 40.4  36.0 - 46.0 % Final   MCV 01/06/2024 82.4  80.0 - 100.0 fL Final   MCH 01/06/2024 28.0  26.0 - 34.0 pg Final   MCHC 01/06/2024 33.9  30.0 - 36.0 g/dL Final   RDW 92/92/7974 12.9  11.5 - 15.5 % Final   Platelets 01/06/2024 257  150 - 400 K/uL Final   nRBC 01/06/2024 0.0  0.0 - 0.2 % Final   Neutrophils Relative % 01/06/2024 52  % Final   Neutro Abs 01/06/2024 3.7  1.7 - 7.7 K/uL Final   Lymphocytes Relative 01/06/2024 36  % Final   Lymphs Abs 01/06/2024 2.5  0.7 - 4.0 K/uL Final   Monocytes Relative 01/06/2024 9  % Final   Monocytes Absolute 01/06/2024 0.7  0.1 - 1.0 K/uL Final   Eosinophils Relative 01/06/2024 2  % Final   Eosinophils Absolute 01/06/2024 0.1  0.0 - 0.5 K/uL Final   Basophils Relative 01/06/2024 1  % Final   Basophils Absolute 01/06/2024 0.1  0.0 - 0.1 K/uL Final   Immature Granulocytes 01/06/2024 0  % Final   Abs Immature Granulocytes 01/06/2024 0.02  0.00 - 0.07 K/uL Final   Performed at [facility], [address], [place], [place] [address]   Sodium 01/06/2024 138  135 - 145 mmol/L Final   Potassium 01/06/2024 3.7  3.5 - 5.1 mmol/L Final   Chloride 01/06/2024 103  98 - 111 mmol/L Final   CO2 01/06/2024 23  22 - 32 mmol/L Final   Glucose, Bld 01/06/2024 111 (H)  70 - 99 mg/dL Final   Glucose reference range applies only to samples taken after fasting for at least 8 hours.   BUN 01/06/2024 7  6 - 20 mg/dL Final   Creatinine, Ser 01/06/2024 0.72  0.44 - 1.00 mg/dL Final   Calcium 92/92/7974 9.6  8.9 - 10.3 mg/dL Final   Total Protein 92/92/7974 7.4  6.5 - 8.1 g/dL Final   Albumin  92/92/7974 4.5  3.5 - 5.0 g/dL Final   AST 92/92/7974 23  15 - 41 U/L Final   ALT 01/06/2024 14  0 - 44 U/L Final   Alkaline Phosphatase 01/06/2024 52  38 - 126 U/L Final   Total Bilirubin 01/06/2024 0.3  0.0 - 1.2 mg/dL Final   GFR, Estimated 01/06/2024 >60  >60 mL/min Final   Comment: (NOTE) Calculated using the CKD-EPI Creatinine Equation (2021)    Anion gap 01/06/2024 12  5 - 15 Final   Performed at [facility], [address] [place], [place] [address]  Admission on 10/08/2023, Discharged on 10/09/2023  Component Date Value Ref Range Status   WBC 10/09/2023 8.7  4.0 - 10.5 K/uL Final   RBC 10/09/2023 4.95  3.87 - 5.11 MIL/uL Final   Hemoglobin 10/09/2023 13.9  12.0 - 15.0 g/dL Final   HCT 95/90/7974 41.8  36.0 - 46.0 % Final   MCV 10/09/2023 84.4  80.0 - 100.0 fL Final   MCH 10/09/2023 28.1  26.0 - 34.0 pg Final   MCHC 10/09/2023 33.3  30.0 - 36.0 g/dL Final   RDW 95/90/7974 12.6  11.5 - 15.5 % Final   Platelets 10/09/2023 276  150 - 400 K/uL Final   nRBC 10/09/2023 0.0  0.0 - 0.2 % Final   Performed at [facility], [address], [place], [place] [address]   Sodium 10/09/2023 139  135 - 145 mmol/L Final   Potassium 10/09/2023 4.3  3.5 - 5.1 mmol/L Final   Chloride 10/09/2023 104  98 - 111 mmol/L Final   CO2 10/09/2023 26  22 - 32 mmol/L Final   Glucose, Bld 10/09/2023 89  70 - 99 mg/dL Final   Glucose reference range applies only to samples taken after fasting for at least 8 hours.   BUN 10/09/2023 11  6 - 20 mg/dL Final   Creatinine, Ser 10/09/2023 0.76  0.44 - 1.00 mg/dL Final   Calcium 95/90/7974 9.8  8.9 - 10.3 mg/dL Final   GFR, Estimated 10/09/2023 >60  >60 mL/min Final   Comment: (NOTE) Calculated using the CKD-EPI Creatinine Equation (2021)    Anion gap 10/09/2023 9  5 - 15 Final   Performed at [facility], [address], [place], [place] [address]    Blood Alcohol level:  Lab Results  Component Value Date    ETH <10 07/27/2022   ETH <10 07/15/2017    Metabolic Disorder Labs: Lab Results  Component Value Date   HGBA1C 4.6 (L) 01/24/2024   MPG 85.32 01/24/2024   MPG 105.41 07/27/2022   Lab Results  Component Value Date   PROLACTIN 29.7 (H) 12/18/2018   Lab Results  Component Value Date   CHOL 144 01/24/2024   TRIG 58 01/24/2024   HDL 59 01/24/2024   CHOLHDL 2.4 01/24/2024   VLDL 12 01/24/2024   LDLCALC 73 01/24/2024   LDLCALC 67 07/27/2022    Therapeutic Lab Levels: No results found for: LITHIUM No results found for: VALPROATE No results found for: CBMZ  Physical Findings   AIMS    Flowsheet Row [facility]) [facility] Admission (Discharged) from OP [facility] Admission (Discharged) from OP Visit [facility]  AIMS Total Score 0 0 0   GAD-7    Flowsheet Row Office Visit from 10/04/2023 in [facility] Counselor from 09/23/2023 in [facility] Office Visit from 06/20/2023 in [facility] [place] Office Visit from 11/09/2022 in [facility] Office Visit from 05/16/2022 in [facility] at [place]  Total GAD-7 Score 15 7 4 14 7    PHQ2-9    Flowsheet Row Office Visit from 10/04/2023 in [facility] Counselor from 09/23/2023 in [facility] Office Visit from 06/20/2023 in [facility] for [facility] at [place] Office Visit from 11/09/2022 in [facility] Office Visit from 05/16/2022 in [facility] Total Score 3 3 0 3 1  PHQ-9 Total Score 10 12 2 16 3    Flowsheet Row ED from 01/24/2024 in [facility] ED from 01/06/2024 in [facility] Emergency Department at [facility] ED from 10/08/2023 in [facility] Emergency Department at [facility]  C-SSRS RISK CATEGORY High Risk No Risk No Risk     Musculoskeletal  Strength & Muscle Tone: within normal limits Gait & Station: not assessed. Patient leans: N/A  Psychiatric Specialty Exam  Presentation (0830 interview) General Appearance:  Casual Septum nose piercing and nasal bridge piercing.  Eye Contact: Poor  Speech: Speaking  slowly with brief responses and increased speech latency  Speech Volume: Decreased  Handedness: Right   Mood and Affect  Mood: Normal and neutral  Affect: Blunted  Thought Process  Thought Processes: Organized  Descriptions of Associations: Unable to assess given brevity of responses and somnolence  Orientation: Not directly assessed  Thought Content: within normal limits   Diagnosis of Schizophrenia or Schizoaffective disorder in past: No  Duration of Psychotic Symptoms: Less than six months   Hallucinations:Hallucinations: Auditory Description of Auditory Hallucinations: Pt reports voices in my head, talking constantly.  Ideas of Reference:None  Suicidal Thoughts: None this morning  Homicidal Thoughts: None this morning  Sensorium  Memory: Not assessed  Judgment: Poor  Insight: Lacking   Executive Functions  Concentration: Poor  Attention Span: Poor  Recall: Poor  Fund of Knowledge: Fine  Language: Fine  Psychomotor Activity  Psychomotor Activity: Psychomotor retardation possibly secondary to somnolence  Assets  Assets: Communication Skills; Desire for Improvement  Sleep  Sleep: Fair  No Safety Checks orders active in given range  Nutritional Assessment (For OBS and FBC admissions only) Has the patient had a weight loss or gain of 10 pounds or more in the last 3 months?: No Has the patient had a decrease in food intake/or  appetite?: No Does the patient have dental problems?: No Does the patient have eating habits or behaviors that may be indicators of an eating disorder including binging or inducing vomiting?: No Has the patient recently lost weight without trying?: 0 Has the patient been eating poorly because of a decreased appetite?: 0 Malnutrition Screening Tool Score: 0    Physical Exam  Physical Exam Constitutional:      Appearance: Normal appearance. [name] is well-groomed and underweight.  Pulmonary:     Effort: Pulmonary effort is normal.  Psychiatric:        Mood and Affect: Affect is blunt.        Speech: Speech is delayed.        Behavior: Behavior is slowed.        Thought Content: Thought content does not include homicidal or suicidal plan.    Review of Systems  Psychiatric/Behavioral:  Positive for hallucinations. Negative for suicidal ideas.    Blood pressure 93/69, pulse 68, temperature 98.4 F (36.9 C), temperature source Oral, resp. rate 17, last menstrual period 01/06/2024, SpO2 100%. There is no height or weight on file to calculate BMI.   Suicide Risk Assessment Demographic Factors:  Adolescent or young adult, Caucasian, and transgender  Loss Factors: Loss of significant relationship  Historical Factors: History of self harm via burning fingertips  Risk Reduction Factors:   Positive social support  Cognitive Features That Contribute To Risk:  None    Suicide Risk:  Moderate:  Frequent suicidal ideation with limited intensity, and duration, some specificity in terms of plans, unclear intent, some risk factors present, and identifiable protective factors.  Treatment Plan Summary:  # Psychosis, unspecified type # Suicidal ideation # Hx of MDD with psychotic features  # Hx of PTSD, ADHD, anxiety, anorexia nervosa, and ?dissociative personality disorder # Hx THC use  Psychosis, specified type VS Substance use induced psychosis VS Mania Patient presented  yesterday with suicidal ideation, A&VH, disorganized thinking, and bizarre behaviors. On assessment this morning, they are somnolent and difficult to arouse (even more so than expected following trazodone 50 mg and Zyprexa  5 mg), limiting assessment. This patient also has other psychiatric diagnoses, such as MDD with psychotic features, PTSD, ADHD, anxiety,  anorexia, and dissociative identity disorder, complicating the diagnostic process. Bipolar disorder with psychotic features is possible given initial presentation followed by hypersomnolence, which could suggest mania-induced sleeplessness in the process of resolving. Also on the differential is a primary psychotic disorder given A&VH, disorganized thinking, and bizarre behaviors. Also possible is substance-induced psychosis given history of THC use; while a negative UDS makes some substances less likely, other undetected substances (ex. Synthetic THC derivatives, cathinones) could still have contributed to this presentation. Lastly, cluster B personality disorders merit consideration.  Patient would benefit from inpatient hospitalization for safety, further investigation of psychotic features, and medication management.   Disposition: Transfer to higher level of care  Jayson LELON Ness, Medical Student 01/24/2024 10:10 AM  Penne Mori, DO PGY - 1  Psychiatry 01/24/24 11:15 AM

## 2024-01-24 NOTE — ED Notes (Signed)
 Pt observed lying in bed. Eyes closed respirations even and non labored. NAD Hourly observations continue for safety.
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
## 2024-01-24 NOTE — ED Notes (Signed)
 Stable. Transferring to  [facility] via  safe transport  all belongings and valuable sent with with patient and. Safe transport  Denied current SI plan and intent.  Denied HI and A/V hallucinations

## 2024-01-24 NOTE — ED Notes (Signed)
 Patient alert and oriented. Denies SI, HI, AVH, and pain. Skin assessed w/no open wounds found. 3 piercing in nose, displays some anxiety, yet cooperative. Urine specimen given after getting on unit. Pt states I just want know if I'm pregnant or not; I've had chlamydia 4 times now.  Support and encouragement provided.  Routine safety checks conducted. Patient informed to notify staff with problems or concerns. Patient contracts for safety at this time. Patient compliant with treatment plan. Patient receptive, calm, and cooperative.Patient remains safe at this time.

## 2024-01-24 NOTE — ED Notes (Signed)
 Pt in bed w/eyes closed resting quietly. Respirations equal and unlabored. No signs or symptoms of distress. Routine safety checks maintained/ongoing.

## 2024-01-25 DIAGNOSIS — F259 Schizoaffective disorder, unspecified: Secondary | ICD-10-CM

## 2024-01-25 DIAGNOSIS — F22 Delusional disorders: Secondary | ICD-10-CM | POA: Insufficient documentation

## 2024-01-25 DIAGNOSIS — F429 Obsessive-compulsive disorder, unspecified: Secondary | ICD-10-CM | POA: Insufficient documentation

## 2024-01-25 DIAGNOSIS — F603 Borderline personality disorder: Secondary | ICD-10-CM

## 2024-01-25 MED ORDER — TRAZODONE HCL 50 MG PO TABS
50.0000 mg | ORAL_TABLET | Freq: Every evening | ORAL | Status: DC | PRN
  Administered 2024-01-25: 50 mg via ORAL
  Filled 2024-01-25: qty 1

## 2024-01-25 MED ORDER — FLUOXETINE HCL 10 MG PO CAPS
10.0000 mg | ORAL_CAPSULE | Freq: Every day | ORAL | Status: DC
  Administered 2024-01-25 – 2024-01-27 (×3): 10 mg via ORAL
  Filled 2024-01-25 (×3): qty 1

## 2024-01-25 MED ORDER — ENSURE PLUS HIGH PROTEIN PO LIQD
237.0000 mL | Freq: Two times a day (BID) | ORAL | Status: DC
  Administered 2024-01-25 – 2024-01-28 (×7): 237 mL via ORAL
  Filled 2024-01-25 (×9): qty 237

## 2024-01-25 MED ORDER — RISPERIDONE 0.5 MG PO TABS
0.5000 mg | ORAL_TABLET | Freq: Two times a day (BID) | ORAL | Status: DC
  Administered 2024-01-25 – 2024-01-28 (×6): 0.5 mg via ORAL
  Filled 2024-01-25 (×6): qty 1

## 2024-01-25 NOTE — Plan of Care (Signed)
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
## 2024-01-25 NOTE — Group Note (Signed)
@  TD@  10:00-11:00AM  Type of Therapy and Topic:  Group Therapy:  Understanding feelings  Participation Level:  Active   Description of Group:  The patient in this group were given a blob tree worksheet. Patients in this group were introduced to understanding their feelings and to express their emotions.  The patient was able to identify how they are feeling in the current moment. The patient was encourage to define and describe how they are feeling and each type was acted out.  Patients discussed what additional feelings and how it impacts their life and the people around them.  An emphasis was placed on following up with the discharge plan when they leave the hospital to continue to discover their feelings to becoming healthier and happier.    Therapeutic Goals: 1)  Identify feelings from the blob tree  2)  discuss their feeling and the impact of their feelings  3)  discuss commitment to reaching their feelings      Summary of Patient Progress:  The patient share that she sometimes does not know who is and wants to do that are impacted by her friends and family. The patient share that she has friends but sometime still feels like she distant.   Therapeutic Modalities:   Psychoeducation Brief Solution-Focused Therapy

## 2024-01-25 NOTE — Progress Notes (Signed)
 Pt is an 18 year old female admitted voluntary from [facility] for SI with multiple plans and HI towards their boyfriend. Hx of DID, MDD, and PTSD. Pt endorses current AVH and paranoia. Denies SI and HI currently. Pt was calm and cooperative during assessment. Admission plan of care reviewed with pt, consents signed. Personal belongings/skin assessment completed. Patient oriented to the unit, staff and room. Safety checks initiated at q 15 minutes. Verbalizes understanding of unit rules/protocols. Patient is presently safe on the unit. No unsafe behaviors noted.
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
## 2024-01-25 NOTE — Tx Team (Signed)
 Initial Treatment Plan 01/25/2024 1:25 AM Sharyne Laster FMW:[id number]    PATIENT STRESSORS: Marital or family conflict     PATIENT STRENGTHS: Active sense of humor  Motivation for treatment/growth  Physical Health  Supportive family/friends    PATIENT IDENTIFIED PROBLEMS: Suicidal ideation with multiple plans  Homicidal ideation towards boyfriend   Anger/explosive reactions   Self destructive behavior                DISCHARGE CRITERIA:  Improved stabilization in mood, thinking, and/or behavior Motivation to continue treatment in a less acute level of care Verbal commitment to aftercare and medication compliance  PRELIMINARY DISCHARGE PLAN: Outpatient therapy Return to previous living arrangement Return to previous work or school arrangements  PATIENT/FAMILY INVOLVEMENT: This treatment plan has been presented to and reviewed with the patient, Evelyn Owens.  The patient has been given the opportunity to ask questions and make suggestions.  Evelyn Safley C D'Addio, RN 01/25/2024, 1:25 AM
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
## 2024-01-25 NOTE — Progress Notes (Signed)
   01/25/24 2000  Psych Admission Type (Psych Patients Only)  Admission Status Voluntary  Psychosocial Assessment  Patient Complaints Anxiety  Eye Contact Fair  Facial Expression Animated  Affect Euphoric  Speech Logical/coherent  Interaction Childlike  Motor Activity Fidgety  Appearance/Hygiene Unremarkable  Behavior Characteristics Cooperative  Mood Pleasant  Aggressive Behavior  Effect No apparent injury  Thought Process  Coherency WDL  Content Preoccupation  Delusions None reported or observed  Perception Hallucinations  Hallucination Auditory;Gustatory  Judgment Impaired  Confusion None  Danger to Self  Current suicidal ideation? Denies   (Sleep Hours) - (Any PRNs that were needed, meds refused, or side effects to meds)-  (Any disturbances and when (visitation, over night)- (Concerns raised by the patient)-  (SI/HI/AVH)-
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
## 2024-01-25 NOTE — [facility] Group Notes (Signed)
 Adult Psychoeducational Group Note  Date:  01/25/2024 Time:  9:34 AM  Group Topic/Focus:  Goals Group:   The focus of this group is to help patients establish daily goals to achieve during treatment and discuss how the patient can incorporate goal setting into their daily lives to aide in recovery.  Participation Level:  Active  Participation Quality:  Appropriate  Affect:  Appropriate  Cognitive:  Alert  Insight: Appropriate  Engagement in Group:  Engaged  Modes of Intervention:  Orientation  Additional Comments:  Pt goal for today is to be open about her feelings when speaking with the provider  Niel CHRISTELLA Nightingale 01/25/2024, 9:34 AM

## 2024-01-25 NOTE — Progress Notes (Signed)
   01/25/24 1810  Spiritual Encounters  Type of Visit Initial  Care provided to: Family  Referral source Chaplain assessment  Reason for visit Routine spiritual support  OnCall Visit No   Met briefly with patient's father, Charlie, after he dropped off belongings. Let him know of ongoing spiritual care support availability.  Mikell Camp L. Fredrica, M.Div [phone]

## 2024-01-25 NOTE — Plan of Care (Signed)
   Problem: Education: Goal: Knowledge of [facility] Education information/materials will improve Outcome: Progressing Goal: Emotional status will improve Outcome: Progressing Goal: Mental status will improve Outcome: Progressing Goal: Verbalization of understanding the information provided will improve Outcome: Progressing

## 2024-01-25 NOTE — Progress Notes (Signed)
(  Sleep Hours) - 6.25 hours as of 0530 (Any PRNs that were needed, meds refused, or side effects to meds)- No PRN meds given, no meds refused.  (Any disturbances and when (visitation, over night)- None  (Concerns raised by the patient)- No concerns raised by the pt. (SI/HI/AVH)- Denies SI/HI. Endorses AVH.

## 2024-01-26 LAB — GLUCOSE, CAPILLARY: Glucose-Capillary: 107 mg/dL — ABNORMAL HIGH (ref 70–99)

## 2024-01-26 MED ORDER — TRAZODONE HCL 50 MG PO TABS
25.0000 mg | ORAL_TABLET | Freq: Every evening | ORAL | Status: DC | PRN
  Administered 2024-01-26 – 2024-01-27 (×2): 25 mg via ORAL
  Filled 2024-01-26 (×2): qty 1

## 2024-01-26 NOTE — Plan of Care (Signed)

## 2024-01-26 NOTE — Progress Notes (Signed)
 Patient fell at the nurses station at 1330. She came up to the nurses station to report dizziness. She started to stumble and this RN asked patient to sit on the floor. She sat down and this RN went to get gatorade while a MHT started to get patients vitals. When returning with the gatorade this RN watched the patient try to stand up then fall slowly and land with her head resting on her arm. Vitals within normal limits. CBG 107. No injury apparent. Patient denies pain. Dr. Raliegh notified face to face by another RN and came to see the patient at 1340. No further interventions or orders at this time. Will monitor vitals every 30 minutes for the next two hours. Progress note, post fall flow sheet, post hall huddle form, and safety zone completed ([id number]).

## 2024-01-26 NOTE — Progress Notes (Signed)
(  Sleep Hours) - 6.5 hours (Any PRNs that were needed, meds refused, or side effects to meds)-  Trazodone  given (Any disturbances and when (visitation, over night)- None (Concerns raised by the patient)-  Still some hallucinations reported (SI/HI/AVH)- Denies

## 2024-01-26 NOTE — Group Note (Signed)
 Date:  01/26/2024 Time:  1:01 AM  Group Topic/Focus:  Wrap-Up Group:   The focus of this group is to help patients review their daily goal of treatment and discuss progress on daily workbooks.    Participation Level:  Active  Participation Quality:  Appropriate  Affect:  Appropriate  Cognitive:  Appropriate  Insight: Appropriate  Engagement in Group:  Engaged  Modes of Intervention:  Discussion  Additional Comments:  Patient stated that she had a 7 out of 10 day.  Patient stated that she wants to work on [employer],    Bari Moats 01/26/2024, 1:01 AM

## 2024-01-26 NOTE — Group Note (Signed)
 Date:  01/26/2024 Time:  9:11 AM  Group Topic/Focus:  Goals Group:   The focus of this group is to help patients establish daily goals to achieve during treatment and discuss how the patient can incorporate goal setting into their daily lives to aide in recovery. Orientation:   The focus of this group is to educate the patient on the purpose and policies of crisis stabilization and provide a format to answer questions about their admission.  The group details unit policies and expectations of patients while admitted.    Participation Level:  Active  Participation Quality:  Appropriate  Affect:  Appropriate  Cognitive:  Appropriate  Insight: Good  Engagement in Group:  Engaged  Modes of Intervention:  Orientation  Additional Comments:    Rankin JONETTA Sierra 01/26/2024, 9:11 AM

## 2024-01-26 NOTE — Progress Notes (Signed)
 D: Patient is alert, oriented, pleasant, and cooperative. Denies SI, HI, AVH, and verbally contracts for safety. Patient reports they slept fair last night with sleeping medication. Patient reports their appetite as good, energy level as low, and concentration as good. Patient rates their depression 4/10, hopelessness 3/10, and anxiety 3/10.   Patient had a fall this shift please see previous note. No injury. No intervention needed.    A: Scheduled medications administered per MD order. Support provided. Patient educated on safety on the unit and medications. Routine safety checks every 15 minutes. Patient stated understanding to tell nurse about any new physical symptoms. Patient understands to tell staff of any needs.     R: No adverse drug reactions noted. Patient remains safe at this time and will continue to monitor.    01/26/24 0800  Psych Admission Type (Psych Patients Only)  Admission Status Voluntary  Psychosocial Assessment  Patient Complaints Anxiety;Depression  Eye Contact Fair  Facial Expression Animated  Affect Anxious  Speech Logical/coherent  Interaction Childlike  Motor Activity Fidgety  Appearance/Hygiene Unremarkable  Behavior Characteristics Cooperative  Mood Pleasant  Thought Process  Coherency WDL  Content Preoccupation  Delusions None reported or observed  Perception WDL  Hallucination None reported or observed  Judgment Impaired  Confusion None  Danger to Self  Current suicidal ideation? Denies  Agreement Not to Harm Self Yes  Description of Agreement verbal  Danger to Others  Danger to Others None reported or observed

## 2024-01-26 NOTE — Plan of Care (Signed)

## 2024-01-26 NOTE — Progress Notes (Signed)
 [facility] MD Progress Note  01/26/2024 7:54 AM Evelyn Owens  MRN:  [id number] Subjective:   Evelyn Owens is a 18 year old transgender female who presented on 7/25 to [facility] for SI with a plan, HI without a plan, and psychosis and was admitted to be [facility] on 7/26. PPHx includes MDD, DID, gender dysphoria, ADHD, and GAD with previous psychiatric hospitalizations, last being at [facility] in January 2024.    Case was discussed in the multidisciplinary team. MAR was reviewed and patient is compliant with medications. PRNs previous day include trazodone  50 mg for sleep.   Psychiatric Team made the following recommendations yesterday: -Start Risperdal  0.5 mg twice daily for psychosis -Start Prozac  10 mg daily for MDD, GAD, and PTSD   On interview today patient she slept good last night, but woke up today feeling fatigued.  She reports that her appetite is doing good, eating I feel like my appetite is excessive, I feel like I am eating a lot, which is better than before.  She reports today is the first day of no SI or HI.  She endorses strange thoughts and states she saw a person with small eyes with one eye on their forehead.  She states she still having her regular hallucinations, but I know they are not going to stop just decrease.  She reports no paranoia or ideas of reference.  She reports her medications are making her feel really tired.   Update, 1340: Per nurse, patient was witnessed having a fall.  Per nurse and patient, she did not hit her head, she broke her fall with her arm. She endorses a headache, but states she had it previous to the fall. She denies pain in her arm and on her head. She states the trazodone  has been making her feel groggy, we will plan to decrease her trazodone  from 50 mg to 25 mg prn for sleep.  Patient states she sometimes gets dizzy when standing too quickly and that this has happened before and that she feels better now.  Patient was able to be escorted to the day room  to sit down.  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[facility]) Active Problems:   MDD (major depressive disorder), recurrent, severe, with psychosis (HCC)   Delusional disorder (HCC)   OCD (obsessive compulsive disorder)  Total Time spent with patient: 30 minutes  Past Psychiatric History:  Previous psychiatric diagnoses: MDD, GAD, PTSD, hallucinations, GAD Prior psychiatric treatment: Abilify , Clozaril, Vyvanse , Lexapro , Zyprexa , and trazodone  Psychiatric medication compliance history: Noncompliant  Current psychiatric treatment: Could not recall Current psychiatrist: Could not recall Current therapist: In her recall  Previous hospitalizations: Yes, last admission to be [facility] in January 2024 History of suicide attempts: Yes History of self harm: Yes   Past Medical History:  Past Medical History:  Diagnosis Date   ADHD (attention deficit hyperactivity disorder)    Allergy    Anxiety    Depression    Dissociative identity disorder (HCC) 07/2021   Gender dysphoria 2019   Lactose intolerance     Past Surgical History:  Procedure Laterality Date   MOUTH SURGERY  2023   Family History:  Family History  Problem Relation Age of Onset   Arthritis Mother    Lupus Mother    Yvone' disease Mother    Rheum arthritis Mother    Arthritis Father    Family Psychiatric  History: Mother with borderline personality disorder, father with PTSD and ADHD, paternal and maternal  grandfathers with schizo-something (referring to schizophrenia vs. schizoaffective disorder).  Social History:  Social History   Substance and Sexual Activity  Alcohol Use No     Social History   Substance and Sexual Activity  Drug Use Not Currently   Types: Marijuana    Social History   Socioeconomic History   Marital status: Single    Spouse name: Not on file   Number of children: Not on file   Years of education: Not on  file   Highest education level: Not on file  Occupational History   Not on file  Tobacco Use   Smoking status: Every Day    Types: Cigarettes    Passive exposure: Yes   Smokeless tobacco: Never  Vaping Use   Vaping status: Former  Substance and Sexual Activity   Alcohol use: No   Drug use: Not Currently    Types: Marijuana   Sexual activity: Yes    Partners: Male    Birth control/protection: Condom, Pill    Comment: polyamorous  Other Topics Concern   Not on file  Social History Narrative   Not on file   Social Drivers of Health   Financial Resource Strain: High Risk (09/23/2023)   Overall Financial Resource Strain (CARDIA)    Difficulty of Paying Living Expenses: Very hard  Food Insecurity: Patient Declined (01/24/2024)   Hunger Vital Sign    Worried About Running Out of Food in the Last Year: Patient declined    Ran Out of Food in the Last Year: Patient declined  Recent Concern: Food Insecurity - Food Insecurity Present (01/24/2024)   Hunger Vital Sign    Worried About Running Out of Food in the Last Year: Sometimes true    Ran Out of Food in the Last Year: Sometimes true  Transportation Needs: No Transportation Needs (01/24/2024)   PRAPARE - Administrator, Civil Service (Medical): No    Lack of Transportation (Non-Medical): No  Recent Concern: Transportation Needs - Unmet Transportation Needs (01/24/2024)   PRAPARE - Transportation    Lack of Transportation (Medical): Yes    Lack of Transportation (Non-Medical): Yes  Physical Activity: Insufficiently Active (09/23/2023)   Exercise Vital Sign    Days of Exercise per Week: 2 days    Minutes of Exercise per Session: 20 min  Stress: Stress Concern Present (09/23/2023)   [employer] of Occupational Health - Occupational Stress Questionnaire    Feeling of Stress : Rather much  Social Connections: Moderately Isolated (09/23/2023)   Social Connection and Isolation Panel    Frequency of Communication with  Friends and Family: More than three times a week    Frequency of Social Gatherings with Friends and Family: Twice a week    Attends Religious Services: 1 to 4 times per year    Active Member of [employer] or Organizations: No    Attends Banker Meetings: Never    Marital Status: Never married   Additional Social History:                         Sleep: Good Estimated Sleeping Duration (Last 24 Hours): 5.75-6.75 hours  Appetite:  Good  Current Medications: Current Facility-Administered Medications  Medication Dose Route Frequency Provider Last Rate Last Admin   acetaminophen  (TYLENOL ) tablet 650 mg  650 mg Oral Q6H PRN Trudy Carwin, NP       alum & mag hydroxide-simeth (MAALOX/MYLANTA) 200-200-20 MG/5ML suspension 30 mL  30 mL  Oral Q4H PRN Trudy Carwin, NP       haloperidol  lactate (HALDOL ) injection 5 mg  5 mg Intramuscular TID PRN Trudy Carwin, NP       And   diphenhydrAMINE  (BENADRYL ) injection 50 mg  50 mg Intramuscular TID PRN Trudy Carwin, NP       And   LORazepam  (ATIVAN ) injection 2 mg  2 mg Intramuscular TID PRN Trudy Carwin, NP       haloperidol  lactate (HALDOL ) injection 10 mg  10 mg Intramuscular TID PRN Trudy Carwin, NP       And   diphenhydrAMINE  (BENADRYL ) injection 50 mg  50 mg Intramuscular TID PRN Trudy Carwin, NP       And   LORazepam  (ATIVAN ) injection 2 mg  2 mg Intramuscular TID PRN Trudy Carwin, NP       feeding supplement (ENSURE PLUS HIGH PROTEIN) liquid 237 mL  237 mL Oral BID BM Pashayan, Alexander GORMAN, DO   237 mL at 01/25/24 1315   FLUoxetine  (PROZAC ) capsule 10 mg  10 mg Oral Daily Prunty, Donald B, DO   10 mg at 01/25/24 1315   magnesium  hydroxide (MILK OF MAGNESIA) suspension 30 mL  30 mL Oral Daily PRN Trudy Carwin, NP       OLANZapine  zydis (ZYPREXA ) disintegrating tablet 5 mg  5 mg Oral TID PRN Trudy Carwin, NP       risperiDONE  (RISPERDAL ) tablet 0.5 mg  0.5 mg Oral BID Prunty, Donald B, DO   0.5 mg at 01/25/24 1614    traZODone  (DESYREL ) tablet 50 mg  50 mg Oral QHS PRN Bobbitt, Shalon E, NP   50 mg at 01/25/24 2314    Lab Results: No results found for this or any previous visit (from the past 48 hours).  Blood Alcohol level:  Lab Results  Component Value Date   ETH <10 07/27/2022   ETH <10 07/15/2017    Metabolic Disorder Labs: Lab Results  Component Value Date   HGBA1C 4.6 (L) 01/24/2024   MPG 85.32 01/24/2024   MPG 105.41 07/27/2022   Lab Results  Component Value Date   PROLACTIN 29.7 (H) 12/18/2018   Lab Results  Component Value Date   CHOL 144 01/24/2024   TRIG 58 01/24/2024   HDL 59 01/24/2024   CHOLHDL 2.4 01/24/2024   VLDL 12 01/24/2024   LDLCALC 73 01/24/2024   LDLCALC 67 07/27/2022    Physical Findings: AIMS:  ,  ,  ,  ,  ,  ,   CIWA:    COWS:     Musculoskeletal: Strength & Muscle Tone: within normal limits Gait & Station: normal Patient leans: N/A  Psychiatric Specialty Exam:  Presentation  General Appearance:  Casual  Eye Contact: Fair  Speech: Normal Rate  Speech Volume: Normal  Handedness: Right   Mood and Affect  Mood: Euthymic  Affect: Congruent   Thought Process  Thought Processes: Linear  Descriptions of Associations:Intact  Orientation:Full (Time, Place and Person)  Thought Content:Logical; Scattered  History of Schizophrenia/Schizoaffective disorder:No  Duration of Psychotic Symptoms:Less than six months  Hallucinations:Hallucinations: None  Ideas of Reference:None  Suicidal Thoughts:Suicidal Thoughts: Yes, Passive  Homicidal Thoughts:Homicidal Thoughts: Yes, Active   Sensorium  Memory: Immediate Fair; Recent Fair  Judgment: Poor  Insight: Poor   Executive Functions  Concentration: Good  Attention Span: Fair  Recall: Fair; Poor  Fund of Knowledge: Poor  Language: Poor   Psychomotor Activity  Psychomotor Activity: Psychomotor Activity: Normal   Assets  Assets: Communication  Skills   Sleep  Sleep: Sleep: Poor    Physical Exam: Physical Exam Vitals and nursing note reviewed.  Constitutional:      Appearance: Normal appearance. [name] is normal weight.  HENT:     Head: Normocephalic and atraumatic.  Pulmonary:     Effort: Pulmonary effort is normal.    Review of Systems  Gastrointestinal:  Negative for abdominal pain, diarrhea, nausea and vomiting.  Neurological:  Negative for dizziness and headaches.  Psychiatric/Behavioral:  Positive for hallucinations. Negative for suicidal ideas. The patient is not nervous/anxious and does not have insomnia.    Blood pressure 107/80, pulse 92, temperature 98.2 F (36.8 C), temperature source Oral, resp. rate 17, height 5' 3 (1.6 m), weight 48.1 kg, last menstrual period 01/06/2024, SpO2 100%. Body mass index is 18.78 kg/m.   Treatment Plan Summary:  Assessment/Plan:  Kaizley Aja is a 18 year old transgender female who presented on 7/25 to [facility] for SI with a plan, HI without a plan, and psychosis and was admitted to be [facility] on 7/26. PPHx includes MDD, DID, gender dysphoria, ADHD, and GAD with previous psychiatric hospitalizations, last being at [facility] in January 2024.   Currently, the patient reports improved mood with the first day of no SI or HI.  Psychotic symptoms persist but are less distressing; the patient continues to endorse AVH but denies paranoia or ideas of reference.  Appetite has improved and is now described as excessive, which they view positively compared to baseline.  Patient reports significant fatigue, which may be related to recently initiated medications.  At 1340, the patient experienced a witnessed fall without head trauma or significant injury.  The patient denies new pain aside from a pre-existing headache and attributes the fall to possible orthostatic dizziness.  Trazodone  has been causing grogginess, contributing to daytime sedation.  We will plan to decrease trazodone  from 50 mg to 25  mg and continue to monitor residual sedation.  MDD, recurrent, severe, with psychosis - Decrease trazodone  from 50 mg to 25 mg at bedtime as needed for insomnia -Continue Prozac  10 mg daily, with plan to titrate up to 20 mg on 7/28 - Atarax  25 mg TID as needed for anxiety - Agitation Protocol: Adderall, Benadryl , lorazepam   DID vs delusional disorder/PTSD/OCD -Continue Risperdal  0.5 mg twice daily for psychotic symptoms  Other as needed medications  Tylenol  650 mg every 6 hours as needed for pain Mylanta 30 mL every 4 hours as needed for indigestion Milk of magnesia 30 mL daily as needed for constipation  Daily contact with patient to assess and evaluate symptoms and progress in treatment and Medication management  I certify that inpatient services furnished can reasonably be expected to improve the patient's condition.    Alan Maiden, MD 01/26/2024, 7:54 AM

## 2024-01-26 NOTE — BHH Group Notes (Signed)
Patient did not attend the Wrap-up group. 

## 2024-01-27 ENCOUNTER — Encounter (HOSPITAL_COMMUNITY): Payer: Self-pay

## 2024-01-27 DIAGNOSIS — F22 Delusional disorders: Secondary | ICD-10-CM

## 2024-01-27 DIAGNOSIS — F4481 Dissociative identity disorder: Secondary | ICD-10-CM

## 2024-01-27 DIAGNOSIS — F429 Obsessive-compulsive disorder, unspecified: Secondary | ICD-10-CM

## 2024-01-27 DIAGNOSIS — F332 Major depressive disorder, recurrent severe without psychotic features: Secondary | ICD-10-CM

## 2024-01-27 MED ORDER — FLUOXETINE HCL 20 MG PO CAPS
20.0000 mg | ORAL_CAPSULE | Freq: Every day | ORAL | Status: DC
  Administered 2024-01-28: 20 mg via ORAL
  Filled 2024-01-27: qty 1

## 2024-01-27 NOTE — Plan of Care (Signed)
  Problem: Education: Goal: Knowledge of [facility] Education information/materials will improve 01/27/2024 0553 by Cyrus Law, RN  Goal: Emotional status will improve  Goal: Verbalization of understanding the information provided will improve Outcome: Progressing

## 2024-01-27 NOTE — Group Note (Signed)
 Occupational Therapy Group Note  Group Topic:Coping Skills  Group Date: 01/27/2024 Start Time: 1430 End Time: 1500 Facilitators: Dot [place] MATSU, OT   Group Description: Group encouraged increased engagement and participation through discussion and activity focused on Coping Ahead. Patients were split up into teams and selected a card from a stack of positive coping strategies. Patients were instructed to act out/charade the coping skill for other peers to guess and receive points for their team. Discussion followed with a focus on identifying additional positive coping strategies and patients shared how they were going to cope ahead over the weekend while continuing hospitalization stay.  Therapeutic Goal(s): Identify positive vs negative coping strategies. Identify coping skills to be used during hospitalization vs coping skills outside of hospital/at home Increase participation in therapeutic group environment and promote engagement in treatment   Participation Level: Engaged   Participation Quality: Independent   Behavior: Appropriate   Speech/Thought Process: Relevant   Affect/Mood: Appropriate   Insight: Fair   Judgement: Fair      Modes of Intervention: Education  Patient Response to Interventions:  Attentive   Plan: Continue to engage patient in OT groups 2 - 3x/week.  01/27/2024  [place] MATSU Dot, OT  Evelyn Owens, OT

## 2024-01-27 NOTE — BHH Group Notes (Signed)
 BHH Group Notes:  (Nursing/MHT/Case Management/Adjunct)  Date:  01/27/2024  Time:  2000  Type of Therapy:  Alcoholics Anonymous Meeting  Participation Level:  Active  Participation Quality:  Appropriate, Attentive, Sharing, and Supportive  Affect:  Appropriate  Cognitive:  Alert  Insight:  Improving  Engagement in Group:  Engaged  Modes of Intervention:  Clarification, Education, and Support  Summary of Progress/Problems:  Lenora Manuelita RAMAN 01/27/2024, 9:21 PM

## 2024-01-27 NOTE — Plan of Care (Signed)
   Problem: Activity: Goal: Interest or engagement in activities will improve Outcome: Progressing   Problem: Coping: Goal: Ability to verbalize frustrations and anger appropriately will improve Outcome: Progressing   Problem: Safety: Goal: Periods of time without injury will increase Outcome: Progressing

## 2024-01-27 NOTE — Plan of Care (Signed)
   Problem: Education: Goal: Knowledge of [facility] [facility] Education information/materials will improve Outcome: Progressing Goal: Emotional status will improve Outcome: Progressing Goal: Mental status will improve Outcome: Progressing

## 2024-01-27 NOTE — Group Note (Signed)
 Recreation Therapy Group Note   Group Topic:Health and Wellness  Group Date: 01/27/2024 Start Time: 0930 End Time: 0955 Facilitators: Dama Hedgepeth-McCall, LRT,CTRS Location: 300 Hall Dayroom   Group Topic: Wellness   Goal Area(s) Addresses:  Patient will define components of whole wellness. Patient will verbalize benefit of whole wellness.   Behavioral Response: Engaged   Intervention: Music   Activity: LRT and patients engaged in a series of exercises. Patients took turns leading the group in various exercises of their choosing. The easiness or difficulty of the exercises were dependent on what each patient chose to do. LRT and patients engaged in exercise for at least 15 minutes. Patients could get water if needed or take breaks when needed as well.    Education: Wellness, Building control surveyor.    Education Outcome: Acknowledges education/In group clarification offered/Needs additional education.    Affect/Mood: Appropriate   Participation Level: Engaged   Participation Quality: Independent   Behavior: Appropriate   Speech/Thought Process: Focused   Insight: Good   Judgement: Good   Modes of Intervention: Music   Patient Response to Interventions:  Engaged   Education Outcome:  In group clarification offered    Clinical Observations/Individualized Feedback: Pt was bright and engaged. Pt was social with peers and attentive to the exercises being presented. One of the exercises pt led group in was planks. Pt was energized and appeared to enjoy the activity.     Plan: Continue to engage patient in RT group sessions 2-3x/week.   Shemia Bevel-McCall, LRT,CTRS 01/27/2024 1:10 PM

## 2024-01-27 NOTE — Progress Notes (Signed)
(  Sleep Hours) - 7.5  (Any PRNs that were needed, meds refused, or side effects to meds)- Trazodone  - Effective   (Any disturbances and when (visitation, over night)- none  (Concerns raised by the patient)-  (SI/HI/AVH)- none

## 2024-01-27 NOTE — BHH Group Notes (Signed)
 Spiritual care group on grief and loss facilitated by Chaplain Rockie Sofia, B[facility]  Group Goal: Support / Education around grief and loss  Members engage in facilitated group support and psycho-social education.  Group Description:  Following introductions and group rules, group members engaged in facilitated group dialogue and support around topic of loss, with particular support around experiences of loss in their lives. Group Identified types of loss (relationships / self / things) and identified patterns, circumstances, and changes that precipitate losses. Reflected on thoughts / feelings around loss, normalized grief responses, and recognized variety in grief experience. Group encouraged individual reflection on safe space and on the coping skills that they are already utilizing.  Group drew on Adlerian / Rogerian and narrative framework  Patient Progress: Evelyn Owens attended group and actively engaged and participated in group conversation and activities.  They shared about trauma and loss that occurred at a young age and how it has impacted them.

## 2024-01-27 NOTE — Progress Notes (Signed)
   01/27/24 2300  Psych Admission Type (Psych Patients Only)  Admission Status Voluntary  Psychosocial Assessment  Patient Complaints Anxiety;Depression  Eye Contact Fair  Facial Expression Animated  Affect Anxious  Speech Logical/coherent  Interaction Childlike  Motor Activity Fidgety  Appearance/Hygiene Unremarkable  Behavior Characteristics Cooperative;Appropriate to situation  Mood Anxious;Pleasant  Thought Process  Coherency WDL  Content Preoccupation  Delusions None reported or observed  Perception WDL  Hallucination None reported or observed  Judgment Impaired  Confusion None  Danger to Self  Current suicidal ideation? Denies  Agreement Not to Harm Self Yes  Description of Agreement verbal  Danger to Others  Danger to Others None reported or observed

## 2024-01-27 NOTE — Progress Notes (Signed)
   01/27/24 0800  Psych Admission Type (Psych Patients Only)  Admission Status Voluntary  Psychosocial Assessment  Patient Complaints Anxiety;Depression  Eye Contact Fair  Facial Expression Animated  Affect Anxious  Speech Logical/coherent  Interaction Childlike  Motor Activity Fidgety  Appearance/Hygiene Unremarkable  Behavior Characteristics Cooperative;Appropriate to situation  Mood Pleasant  Aggressive Behavior  Effect No apparent injury  Thought Process  Coherency WDL  Content Preoccupation  Delusions None reported or observed  Perception WDL  Hallucination None reported or observed  Judgment Impaired  Confusion None  Danger to Self  Current suicidal ideation? Denies  Agreement Not to Harm Self Yes  Description of Agreement verbal  Danger to Others  Danger to Others None reported or observed

## 2024-01-27 NOTE — Progress Notes (Signed)
 [name] Progress Note  01/27/2024 9:05 AM Evelyn Owens  MRN:  [id number] Subjective:    Evelyn Owens, preferred name Evelyn Owens, is a 18 year old transgender female who presented on 7/25 to [facility] for SI with a plan, HI without a plan, and psychosis and was admitted to be [facility] on 7/26. PPHx includes MDD, DID, gender dysphoria, ADHD, and GAD with previous psychiatric hospitalizations, last being at [facility] in January 2024.    Case was discussed in the multidisciplinary team. MAR was reviewed and patient is compliant with medications. PRNs needed include trazodone  25 mg for insomnia   Psychiatric Team made the following recommendations yesterday: - Decrease trazodone  from 50 mg to 25 mg at bedtime as needed for insomnia due to residual drowsiness in the morning -Continue Prozac  10 mg daily, with plan to titrate up to 20 mg on 7/28 -Continue Risperdal  0.5 mg twice daily for psychotic symptoms    On interview today patient reports they slept good last night, but states without the trazodone  they would not have been able to sleep stating my sleep cycles are switched around, I want to sleep during the day and stay up all night.  Patient reports that their appetite is doing good.  Patient reports no SI.  Patient states HI has gotten significantly better, stating that they are able to now hang out with the female peers without wanting to harm them.  Patient endorses VH and intrusive thoughts, all surrounding gory images including a cross with deer skulls with human organs hanging off of it, black shadowy figures, and thoughts of doctors performing multiple different types of abdominal surgeries.  Patient endorses AH stating they hear just the regular DID stuff.  Patient states they are doing much better due to HI decreasing significantly and a decrease in the disturbing hallucinations, stating that the current hallucinations of the gory stuff is common and comforting to them. Patient reports no paranoia  or ideas of reference.  Patient reports no issues with medications, stating they are no longer dizzy upon wakening due to the decreased dosage of trazodone .    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[facility]) Active Problems:   MDD (major depressive disorder), recurrent, severe, with psychosis (HCC)   Delusional disorder (HCC)   OCD (obsessive compulsive disorder)  Total Time spent with patient: 45 minutes  Past Psychiatric History:  Previous psychiatric diagnoses: MDD, GAD, PTSD, hallucinations, GAD Prior psychiatric treatment: Abilify , Clozaril, Vyvanse , Lexapro , Zyprexa , and trazodone  Psychiatric medication compliance history: Noncompliant   Current psychiatric treatment: Could not recall Current psychiatrist: Could not recall Current therapist: In her recall   Previous hospitalizations: Yes, last admission to be [facility] in January 2024 History of suicide attempts: Yes History of self harm: Yes  Past Medical History:  Past Medical History:  Diagnosis Date   ADHD (attention deficit hyperactivity disorder)    Allergy    Anxiety    Depression    Dissociative identity disorder (HCC) 07/2021   Gender dysphoria 2019   Lactose intolerance     Past Surgical History:  Procedure Laterality Date   MOUTH SURGERY  2023   Family History:  Family History  Problem Relation Age of Onset   Arthritis Mother    Lupus Mother    Yvone' disease Mother    Rheum arthritis Mother    Arthritis Father    Family Psychiatric  History:  Mother: Borderline personality disorder Father: PTSD, ADHD Paternal and maternal grandfathers: Schizo something-referring to  schizophrenia versus schizoaffective disorder Social History:  Social History   Substance and Sexual Activity  Alcohol Use No     Social History   Substance and Sexual Activity  Drug Use Not Currently   Types: Marijuana    Social History    Socioeconomic History   Marital status: Single    Spouse name: Not on file   Number of children: Not on file   Years of education: Not on file   Highest education level: Not on file  Occupational History   Not on file  Tobacco Use   Smoking status: Every Day    Types: Cigarettes    Passive exposure: Yes   Smokeless tobacco: Never  Vaping Use   Vaping status: Former  Substance and Sexual Activity   Alcohol use: No   Drug use: Not Currently    Types: Marijuana   Sexual activity: Yes    Partners: Male    Birth control/protection: Condom, Pill    Comment: polyamorous  Other Topics Concern   Not on file  Social History Narrative   Not on file   Social Drivers of Health   Financial Resource Strain: High Risk (09/23/2023)   Overall Financial Resource Strain (CARDIA)    Difficulty of Paying Living Expenses: Very hard  Food Insecurity: Patient Declined (01/24/2024)   Hunger Vital Sign    Worried About Running Out of Food in the Last Year: Patient declined    Ran Out of Food in the Last Year: Patient declined  Recent Concern: Food Insecurity - Food Insecurity Present (01/24/2024)   Hunger Vital Sign    Worried About Running Out of Food in the Last Year: Sometimes true    Ran Out of Food in the Last Year: Sometimes true  Transportation Needs: No Transportation Needs (01/24/2024)   PRAPARE - Administrator, Civil Service (Medical): No    Lack of Transportation (Non-Medical): No  Recent Concern: Transportation Needs - Unmet Transportation Needs (01/24/2024)   PRAPARE - Transportation    Lack of Transportation (Medical): Yes    Lack of Transportation (Non-Medical): Yes  Physical Activity: Insufficiently Active (09/23/2023)   Exercise Vital Sign    Days of Exercise per Week: 2 days    Minutes of Exercise per Session: 20 min  Stress: Stress Concern Present (09/23/2023)   [employer] of Occupational Health - Occupational Stress Questionnaire    Feeling of Stress :  Rather much  Social Connections: Moderately Isolated (09/23/2023)   Social Connection and Isolation Panel    Frequency of Communication with Friends and Family: More than three times a week    Frequency of Social Gatherings with Friends and Family: Twice a week    Attends Religious Services: 1 to 4 times per year    Active Member of [employer] or Organizations: No    Attends Banker Meetings: Never    Marital Status: Never married   Additional Social History:                         Sleep: Good Estimated Sleeping Duration (Last 24 Hours): 4.50-6.00 hours  Appetite:  Good  Current Medications: Current Facility-Administered Medications  Medication Dose Route Frequency Provider Last Rate Last Admin   acetaminophen  (TYLENOL ) tablet 650 mg  650 mg Oral Q6H PRN Trudy Carwin, NP       alum & mag hydroxide-simeth (MAALOX/MYLANTA) 200-200-20 MG/5ML suspension 30 mL  30 mL Oral Q4H PRN Trudy Carwin, NP  haloperidol  lactate (HALDOL ) injection 5 mg  5 mg Intramuscular TID PRN Trudy Carwin, NP       And   diphenhydrAMINE  (BENADRYL ) injection 50 mg  50 mg Intramuscular TID PRN Trudy Carwin, NP       And   LORazepam  (ATIVAN ) injection 2 mg  2 mg Intramuscular TID PRN Trudy Carwin, NP       haloperidol  lactate (HALDOL ) injection 10 mg  10 mg Intramuscular TID PRN Trudy Carwin, NP       And   diphenhydrAMINE  (BENADRYL ) injection 50 mg  50 mg Intramuscular TID PRN Trudy Carwin, NP       And   LORazepam  (ATIVAN ) injection 2 mg  2 mg Intramuscular TID PRN Trudy Carwin, NP       feeding supplement (ENSURE PLUS HIGH PROTEIN) liquid 237 mL  237 mL Oral BID BM Pashayan, Alexander S, DO   237 mL at 01/27/24 0813   FLUoxetine  (PROZAC ) capsule 10 mg  10 mg Oral Daily Prunty, Donald B, DO   10 mg at 01/27/24 9186   magnesium  hydroxide (MILK OF MAGNESIA) suspension 30 mL  30 mL Oral Daily PRN Trudy Carwin, NP       OLANZapine  zydis (ZYPREXA ) disintegrating tablet 5 mg  5 mg  Oral TID PRN Trudy Carwin, NP       risperiDONE  (RISPERDAL ) tablet 0.5 mg  0.5 mg Oral BID Prunty, Donald B, DO   0.5 mg at 01/27/24 9186   traZODone  (DESYREL ) tablet 25 mg  25 mg Oral QHS PRN Elodie Palma, MD   25 mg at 01/26/24 2159    Lab Results:  Results for orders placed or performed during the hospital encounter of 01/24/24 (from the past 48 hours)  Glucose, capillary     Status: Abnormal   Collection Time: 01/26/24  1:35 PM  Result Value Ref Range   Glucose-Capillary 107 (H) 70 - 99 mg/dL    Comment: Glucose reference range applies only to samples taken after fasting for at least 8 hours.    Blood Alcohol level:  Lab Results  Component Value Date   ETH <10 07/27/2022   ETH <10 07/15/2017    Metabolic Disorder Labs: Lab Results  Component Value Date   HGBA1C 4.6 (L) 01/24/2024   MPG 85.32 01/24/2024   MPG 105.41 07/27/2022   Lab Results  Component Value Date   PROLACTIN 29.7 (H) 12/18/2018   Lab Results  Component Value Date   CHOL 144 01/24/2024   TRIG 58 01/24/2024   HDL 59 01/24/2024   CHOLHDL 2.4 01/24/2024   VLDL 12 01/24/2024   LDLCALC 73 01/24/2024   LDLCALC 67 07/27/2022    Physical Findings: AIMS:  ,  ,  ,  ,  ,  ,   CIWA:    COWS:     Musculoskeletal: Strength & Muscle Tone: within normal limits Gait & Station: normal Patient leans: N/A  Psychiatric Specialty Exam:  Presentation  General Appearance:  Casual  Eye Contact: Fair  Speech: Clear and Coherent  Speech Volume: Normal  Handedness: Right   Mood and Affect  Mood: -- (I'm okay just really tired)  Affect: Congruent   Thought Process  Thought Processes: Coherent  Descriptions of Associations:Intact  Orientation:Full (Time, Place and Person)  Thought Content:WDL  History of Schizophrenia/Schizoaffective disorder:Yes  Duration of Psychotic Symptoms:Less than six months  Hallucinations:Hallucinations: Auditory; Visual Description of Auditory  Hallucinations: I am still having the regular hallucinations Description of Visual Hallucinations: I see a  person a small eyes with one of her eyes on the forehead  Ideas of Reference:None  Suicidal Thoughts:Suicidal Thoughts: No  Homicidal Thoughts:Homicidal Thoughts: No   Sensorium  Memory: Immediate Good; Recent Good  Judgment: Fair  Insight: Poor   Executive Functions  Concentration: Fair  Attention Span: Good  Recall: Good  Fund of Knowledge: Good  Language: Good   Psychomotor Activity  Psychomotor Activity: Psychomotor Activity: Normal   Assets  Assets: Desire for Improvement   Sleep  Sleep: Sleep: Good    Physical Exam: Physical Exam Vitals and nursing note reviewed.  Constitutional:      General: Evelyn Owens is not in acute distress.    Appearance: Normal appearance. [name] is normal weight. [name] is not ill-appearing or toxic-appearing.  HENT:     Head: Normocephalic.  Pulmonary:     Effort: Pulmonary effort is normal.    Review of Systems  Gastrointestinal:  Negative for abdominal pain, constipation, diarrhea, nausea and vomiting.  Neurological:  Negative for dizziness and headaches.   Blood pressure 93/72, pulse (!) 101, temperature 98.3 F (36.8 C), temperature source Oral, resp. rate 16, height 5' 3 (1.6 m), weight 48.1 kg, last menstrual period 01/06/2024, SpO2 100%. Body mass index is 18.78 kg/m.   Treatment Plan Summary: Assessment/Plan:   Rand Boller is a 18 year old transgender female who presented on 7/25 to [facility] for SI with a plan, HI without a plan, and psychosis and was admitted to be [facility] on 7/26. PPHx includes MDD, DID, gender dysphoria, ADHD, and GAD with previous psychiatric hospitalizations, last being at [facility] in January 2024.    Since admission, John impression improvement in both mood and safety.  He denies current SI and reports a self-care reduction and HI, particularly noting the ability to socialize  with female peers without intrusive urges to harm them.  They continued to endorse AVH, describing for bed, [facility] emergency room and voices they associate with their DID.  While these experiences are ongoing, they describe them as familiar comforting, which may indicate a degree of baseline psychosis or dissociative experiences that are not currently distressing to them.  Sleep has improved with trazodone  25 mg as needed.  They note no residual morning dizziness at the lower dose, which was titrated down from 50 due to way sedation.  Appetite is good, and there are no medication concerns reported.  [name] appears to be responding well to the current treatment regimen.  We will titrate the Prozac  up from 10 mg to 20 mg to further address mood symptoms with plans to increase Risperdal  to 1 mg on 7/29.    MDD, recurrent, severe, with psychosis - Continue trazodone   25 mg at bedtime as needed for insomnia - Increase Prozac  from 10 mg to 20 mg daily for depression - Atarax  25 mg TID as needed for anxiety - Agitation Protocol: Adderall, Benadryl , lorazepam    DID vs delusional disorder/PTSD/OCD -Continue Risperdal  0.5 mg twice daily for psychotic symptoms, plan to increase to 1 mg on 7/29   Other as needed medications  Tylenol  650 mg every 6 hours as needed for pain Mylanta 30 mL every 4 hours as needed for indigestion Milk of magnesia 30 mL daily as needed for constipation   Daily contact with patient to assess and evaluate symptoms and progress in treatment and Medication management  Alan Maiden, MD 01/27/2024, 9:05 AM

## 2024-01-27 NOTE — BHH Group Notes (Signed)
 Type of Therapy:  Group Topic/ Focus: Goals Group: The focus of this group is to help patients establish daily goals to achieve during treatment and discuss how the patient can incorporate goal setting into their daily lives to aide in recovery.    Participation Level:  Active   Participation Quality:  Appropriate   Affect:  Appropriate   Cognitive:  Appropriate   Insight:  Appropriate   Engagement in Group:  Engaged   Modes of Intervention:  Discussion   Summary of Progress/Problems:   Patient attended and participated goals group today. No SI/HI. Patient's goal for today is to be more comfortable with speaking about my feelings.

## 2024-01-27 NOTE — BH IP Treatment Plan (Signed)
 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[facility]) Active Problems:   MDD (major depressive disorder), recurrent, severe, with psychosis (HCC)   Delusional disorder (HCC)   OCD (obsessive compulsive disorder)   Current Medications:  Current Facility-Administered Medications  Medication Dose Route Frequency Provider Last Rate Last Admin   acetaminophen  (TYLENOL ) tablet 650 mg  650 mg Oral Q6H PRN Trudy Carwin, NP       alum & mag hydroxide-simeth (MAALOX/MYLANTA) 200-200-20 MG/5ML suspension 30 mL  30 mL Oral Q4H PRN Trudy Carwin, NP       haloperidol  lactate (HALDOL ) injection 5 mg  5 mg Intramuscular TID PRN Trudy Carwin, NP       And   diphenhydrAMINE  (BENADRYL ) injection 50 mg  50 mg Intramuscular TID PRN Trudy Carwin, NP       And   LORazepam  (ATIVAN ) injection 2 mg  2 mg Intramuscular TID PRN Trudy Carwin, NP       haloperidol  lactate (HALDOL ) injection 10 mg  10 mg Intramuscular TID PRN Trudy Carwin, NP       And   diphenhydrAMINE  (BENADRYL ) injection 50 mg  50 mg Intramuscular TID PRN Trudy Carwin, NP       And   LORazepam  (ATIVAN ) injection 2 mg  2 mg Intramuscular TID PRN Trudy Carwin, NP       feeding supplement (ENSURE PLUS HIGH PROTEIN) liquid 237 mL  237 mL Oral BID BM Pashayan, Alexander S, DO   237 mL at 01/27/24 0813   [START ON 01/28/2024] FLUoxetine  (PROZAC ) capsule 20 mg  20 mg Oral Daily Elodie Palma, MD       magnesium  hydroxide (MILK OF MAGNESIA) suspension 30 mL  30 mL Oral Daily PRN Trudy Carwin, NP       OLANZapine  zydis (ZYPREXA ) disintegrating tablet 5 mg  5 mg Oral TID PRN Trudy Carwin, NP       risperiDONE  (RISPERDAL ) tablet 0.5 mg  0.5 mg Oral BID Prunty, Donald B, DO   0.5 mg at 01/27/24 0813   traZODone  (DESYREL ) tablet 25 mg  25 mg  Oral QHS PRN Elodie Palma, MD   25 mg at 01/26/24 2159   PTA Medications: Medications Prior to Admission  Medication Sig Dispense Refill Last Dose/Taking   Drospirenone  (SLYND ) 4 MG TABS Take 1 tablet (4 mg total) by mouth daily. 90 tablet 3    ibuprofen  (ADVIL ) 200 MG tablet Take 200 mg by mouth every 6 (six) hours as needed for moderate pain (pain score 4-6), headache or mild pain (pain score 1-3).      sertraline  (ZOLOFT ) 50 MG tablet Take 1 tablet (50 mg total) by mouth daily. 30 tablet 1     Patient Stressors: Marital or family conflict    Patient Strengths: Active sense of humor  Motivation for treatment/growth  Physical Health  Supportive family/friends   Treatment Modalities: Medication Management, Group therapy, Case management,  1 to 1 session with clinician, Psychoeducation, Recreational therapy.   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  Therapeutic Interventions:  1 to 1 sessions, Unit Group sessions and Medication administration.  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  Therapeutic Interventions: 1 to 1 sessions, Unit Group sessions and Medication administration.  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[facility]) Long Term Goal(s): Knowledge of disease and therapeutic regimen to maintain health will improve  Short Term Goals: Ability to remain free from injury will improve, Ability to verbalize frustration and anger appropriately will improve, Ability to demonstrate self-control, Ability to participate in decision making will improve, Ability to verbalize feelings will improve, Ability to disclose and discuss suicidal ideas, Ability to identify and develop effective coping behaviors will improve, and Compliance with prescribed medications will improve  Medication Management: RN will administer medications as ordered by provider, will assess and evaluate patient's response and provide education to patient for prescribed medication. RN will report any adverse and/or side effects to prescribing provider.  Therapeutic Interventions: 1 on 1 counseling sessions, Psychoeducation, Medication administration, Evaluate responses to treatment, Monitor vital signs and CBGs as ordered, Perform/monitor CIWA, COWS, AIMS and Fall Risk screenings as ordered, Perform wound care treatments as ordered.  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  Short Term Goals: Engage patient in aftercare planning with referrals and resources, Increase social support, Increase ability to appropriately verbalize feelings, Increase emotional regulation, Facilitate  acceptance of mental health diagnosis and concerns, Facilitate patient progression through stages of change regarding substance use diagnoses and concerns, Identify triggers associated with mental health/substance abuse issues, and Increase skills for wellness and recovery  Therapeutic Interventions: Assess for all discharge needs, 1 to 1 time with Social worker, Explore available resources and support systems, Assess for adequacy in community support network, Educate family and significant other(s) on suicide prevention, Complete Psychosocial Assessment, Interpersonal group therapy.  Evaluation of Outcomes: Progressing   Progress in Treatment: Attending groups: Yes. Participating in groups: Yes. Taking medication as prescribed: Yes. Toleration medication: Yes. Family/Significant other contact made: No, will contact:  Meily, [place] (Father) [phone] Patient understands diagnosis: Yes. Discussing patient identified problems/goals with staff: Yes. Medical problems stabilized or resolved: Yes. Denies suicidal/homicidal ideation: Yes. Issues/concerns per patient self-inventory: No. Other: n/a  New problem(s) identified: No, Describe:  None  New Short Term/Long Term Goal(s): medication stabilization, elimination of SI thoughts, development of comprehensive mental wellness plan.   Patient Goals:    Discharge Plan or Barriers: Patient recently admitted. CSW will continue to follow and assess for appropriate referrals and possible discharge planning.    Reason for Continuation of Hospitalization: Anxiety Depression Medication stabilization Other; describe Mood stabilization, discharge planning  Estimated Length of Stay: 3-5 DAYS  Last 3 [place] Suicide Severity Risk Score: Flowsheet Row [facility] Most recent reading  at 01/24/2024 10:53 PM ED from 01/24/2024 in [facility] Most  recent reading at 01/24/2024  3:12 AM ED from 01/06/2024 in [facility] Emergency Department at [facility] Most recent reading at 01/06/2024  5:53 AM  C-SSRS RISK CATEGORY High Risk High Risk No Risk    Last PHQ 2/9 Scores:    10/04/2023   10:17 AM 09/23/2023    9:12 AM 06/20/2023    8:39 AM  Depression screen PHQ 2/9  Decreased Interest 2 2 0  Down, Depressed, Hopeless 1 1 0  PHQ - 2 Score 3 3 0  Altered sleeping 3 3 1   Tired, decreased energy 2 3 0  Change in appetite 0 0 0  Feeling bad or failure about yourself  2 1 1   Trouble concentrating 0 0 0  Moving slowly or fidgety/restless 0 2 0  Suicidal thoughts 0 0 0  PHQ-9 Score 10 12 2   Difficult doing work/chores Extremely dIfficult Extremely dIfficult     Scribe for Treatment Team: Zelda LOISE Merck, LCSW 01/27/2024 5:29 PM

## 2024-01-28 MED ORDER — FLUOXETINE HCL 20 MG PO CAPS: 20.0000 mg | ORAL_CAPSULE | Freq: Every day | ORAL | 0 refills | Status: DC

## 2024-01-28 MED ORDER — RISPERIDONE 0.5 MG PO TABS: 0.5000 mg | ORAL_TABLET | Freq: Two times a day (BID) | ORAL | 0 refills | Status: DC

## 2024-01-28 MED ORDER — TRAZODONE HCL 50 MG PO TABS: 25.0000 mg | ORAL_TABLET | Freq: Every evening | ORAL | 0 refills | Status: DC | PRN

## 2024-01-28 NOTE — Progress Notes (Signed)
   01/28/24 0534  15 Minute Checks  Location Bedroom  Visual Appearance Calm  Behavior Sleeping  Sleep (Behavioral Health Patients Only)  Calculate sleep? (Click Yes once per 24 hr at 0600 safety check) Yes  OTHER  Documented sleep last 24 hours 5.5

## 2024-01-28 NOTE — Group Note (Signed)
 Recreation Therapy Group Note   Group Topic:Animal Assisted Therapy   Group Date: 01/28/2024 Start Time: 0950 End Time: 1030 Facilitators: Eldrige Pitkin-McCall, LRT,CTRS Location: [address] Dayroom   Animal-Assisted Activity (AAA) Program Checklist/Progress Notes Patient Eligibility Criteria Checklist & Daily Group note for Rec Tx Intervention  AAA/T Program Assumption of Risk Form signed by Patient/ or Parent Legal Guardian Yes  Patient is free of allergies or severe asthma Yes  Patient reports no fear of animals Yes  Patient reports no history of cruelty to animals Yes  Patient understands his/her participation is voluntary Yes  Patient washes hands before animal contact Yes  Patient washes hands after animal contact Yes  Behavioral Response: Engaged   Education: Charity fundraiser, Appropriate Animal Interaction   Education Outcome: Acknowledges education.    Affect/Mood: Appropriate   Participation Level: Engaged   Participation Quality: Independent   Behavior: Appropriate   Speech/Thought Process: Focused   Insight: Good   Judgement: Good   Modes of Intervention: Teaching laboratory technician   Patient Response to Interventions:  Engaged   Education Outcome:  In group clarification offered    Clinical Observations/Individualized Feedback: Patient attended session and interacted appropriately with therapy dog and peers. Patient asked appropriate questions about therapy dog and his training. Patient shared stories about their pets at home with group.     Plan: Continue to engage patient in RT group sessions 2-3x/week.   Vivi Piccirilli-McCall, LRT,CTRS  01/28/2024 12:57 PM

## 2024-01-28 NOTE — Group Note (Signed)
 Date:  01/28/2024 Time:  10:00 AM  Group Topic/Focus:  Goals Group:   The focus of this group is to help patients establish daily goals to achieve during treatment and discuss how the patient can incorporate goal setting into their daily lives to aide in recovery.    Participation Level:  Active  Participation Quality:  Appropriate  Affect:  Appropriate  Cognitive:  Appropriate  Insight: Appropriate  Engagement in Group:  Engaged  Modes of Intervention:  Activity  Additional Comments:    Evelyn Owens 01/28/2024, 10:00 AM

## 2024-01-28 NOTE — [facility] Suicide Risk Assessment (Signed)
 [facility] INPATIENT:  Family/Significant Other Suicide Prevention Education  Suicide Prevention Education:  Education Completed; Evelyn Owens (father) - [phone],  has been identified by the patient as the family member/significant other with whom the patient will be residing, and identified as the person(s) who will aid the patient in the event of a mental health crisis (suicidal ideations/suicide attempt).  With written consent from the patient, the family member/significant other has been provided the following suicide prevention education, prior to the and/or following the discharge of the patient.  The suicide prevention education provided includes the following: Suicide risk factors Suicide prevention and interventions National Suicide Hotline telephone number [facility] assessment telephone number [facility] Emergency Assistance 911 [facility] and/or Residential Mobile Crisis Unit telephone number  Request made of family/significant other to: Remove weapons (e.g., guns, rifles, knives), all items previously/currently identified as safety concern.   Remove drugs/medications (over-the-counter, prescriptions, illicit drugs), all items previously/currently identified as a safety concern.  The family member/significant other verbalizes understanding of the suicide prevention education information provided.  The family member/significant other agrees to remove the items of safety concern listed above.  Evelyn Owens Evelyn Ruta, LCSW 01/28/2024, 10:19 AM

## 2024-01-28 NOTE — Transportation (Signed)
 01/28/2024  Evelyn Owens DOB: 2005-12-19 MRN: [id number]   RIDER WAIVER AND RELEASE OF LIABILITY  For the purposes of helping with transportation needs, [facility] partners with outside transportation providers (taxi companies, [place], Catering manager.) to give [place] patients or other approved people the choice of on-demand rides Public librarian) to our buildings for non-emergency visits.  By using [employer], I, the person signing this document, on behalf of myself and/or any legal minors (in my care using the [employer]), agree:  Science writer given to me are supplied by independent, outside transportation providers who do not work for, or have any affiliation with, [employer]. [place] is not a transportation company. [facility] has no control over the quality or safety of the rides I get using [employer]. [place] has no control over whether any outside ride will happen on time or not. [facility] gives no guarantee on the reliability, quality, safety, or availability on any rides, or that no mistakes will happen. I know and accept that traveling by vehicle (car, truck, SVU, fleeta, bus, taxi, etc.) has risks of serious injuries such as disability, being paralyzed, and death. I know and agree the risk of using [employer] is mine alone, and not [employer]. [employer] are provided as is and as are available. The transportation providers are in charge for all inspections and care of the vehicles used to provide these rides. I agree not to take legal action against [facility], its agents, employees, officers, directors, representatives, insurers, attorneys, assigns, successors, subsidiaries, and affiliates at any time for any reasons related directly or indirectly to using [employer]. I also agree not to take legal action against [facility] or its affiliates for any injury, death, or damage to property caused by or related to using  [employer]. I have read this Waiver and Release of Liability, and I understand the terms used in it and their legal meaning. This Waiver is freely and voluntarily given with the understanding that my right (or any legal minors) to legal action against  relating to [employer] is knowingly given up to use these services.   I attest that I read the Ride Waiver and Release of Liability to Evelyn Owens, gave   [place] the opportunity to ask questions and answered the questions asked (if any). I affirm that Evelyn Owens then provided consent for assistance with transportation.

## 2024-01-28 NOTE — Plan of Care (Signed)
   Problem: Education: Goal: Emotional status will improve Outcome: Progressing Goal: Mental status will improve Outcome: Progressing
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
## 2024-01-28 NOTE — Plan of Care (Signed)
  Problem: Activity: Goal: Interest or engagement in activities will improve Outcome: Completed/Met   Problem: Coping: Goal: Ability to verbalize frustrations and anger appropriately will improve Outcome: Completed/Met   Problem: Coping: Goal: Ability to demonstrate self-control will improve Outcome: Completed/Met

## 2024-01-28 NOTE — Progress Notes (Signed)
(  Sleep Hours) - 5.5 (Any PRNs that were needed, meds refused, or side effects to meds)- Trazodone  prns, no medications refused, no side effects to meds (Any disturbances and when (visitation, over night)- N/A (Concerns raised by the patient)- N/A (SI/HI/AVH)- DENIES

## 2024-01-28 NOTE — Discharge Summary (Addendum)
 Physician Discharge Summary Note  Patient:  Evelyn Owens is an 18 y.o., female MRN:  [id number] DOB:  15-Jun-2006 Patient phone:  [phone] (home)  Patient address:   [address] [place] [address],  Total Time spent with patient: 45 minutes  Date of Admission:  01/24/2024 Date of Discharge: 01/28/2024  Reason for Admission:   Evelyn Owens, preferred name Evelyn Owens, is a 18 year old transgender female who presented on 7/25 to [facility] for SI with a plan, HI without a plan, and psychosis and was admitted to be [facility] on 7/26. PPHx includes MDD, DID, gender dysphoria, ADHD, and GAD with previous psychiatric hospitalizations, last being at [facility] in January 2024.     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[facility]) Discharge Diagnoses: Principal Problem:   Dissociative identity disorder [facility]) Active Problems:   MDD (major depressive disorder), recurrent, severe, with psychosis (HCC)   Delusional disorder (HCC)   OCD (obsessive compulsive disorder)   Past Psychiatric History:  Previous psychiatric diagnoses: MDD, GAD, PTSD, hallucinations, GAD Prior psychiatric treatment: Abilify , Clozaril, Vyvanse , Lexapro , Zyprexa , and trazodone  Psychiatric medication compliance history: Noncompliant   Current psychiatric treatment: Could not recall Current psychiatrist: Could not recall Current therapist: In her recall   Previous hospitalizations: Yes, last admission to be [facility] in January 2024 History of suicide attempts: Yes History of self harm: Yes  Past Medical History:  Past Medical History:  Diagnosis Date   ADHD (attention deficit hyperactivity disorder)    Allergy    Anxiety    Depression    Dissociative identity disorder (HCC) 07/2021   Gender dysphoria 2019   Lactose intolerance     Past Surgical History:  Procedure Laterality Date   MOUTH SURGERY  2023   Family History:  Family History  Problem Relation Age of Onset   Arthritis Mother    Lupus Mother     Yvone' disease Mother    Rheum arthritis Mother    Arthritis Father    Family Psychiatric  History:  Mother: Borderline personality disorder Father: PTSD, ADHD Paternal and maternal grandfathers: Schizo something-referring to schizophrenia versus schizoaffective disorder Social History:  Social History   Substance and Sexual Activity  Alcohol Use No     Social History   Substance and Sexual Activity  Drug Use Not Currently   Types: Marijuana    Social History   Socioeconomic History   Marital status: Single    Spouse name: Not on file   Number of children: Not on file   Years of education: Not on file   Highest education level: Not on file  Occupational History   Not on file  Tobacco Use   Smoking status: Every Day    Types: Cigarettes    Passive exposure: Yes   Smokeless tobacco: Never  Vaping Use   Vaping status: Former  Substance and Sexual Activity   Alcohol use: No   Drug use: Not Currently    Types: Marijuana   Sexual activity: Yes    Partners: Male    Birth control/protection: Condom, Pill    Comment: polyamorous  Other Topics Concern   Not on file  Social History Narrative   Not on file   Social Drivers of Health   Financial Resource Strain: High Risk (09/23/2023)   Overall Financial Resource Strain (CARDIA)    Difficulty of Paying Living Expenses: Very hard  Food Insecurity: Patient Declined (01/24/2024)   Hunger Vital Sign    Worried About Running Out of Food in the Last Year: Patient declined  Ran Out of Food in the Last Year: Patient declined  Recent Concern: Food Insecurity - Food Insecurity Present (01/24/2024)   Hunger Vital Sign    Worried About Running Out of Food in the Last Year: Sometimes true    Ran Out of Food in the Last Year: Sometimes true  Transportation Needs: No Transportation Needs (01/24/2024)   PRAPARE - Administrator, Civil Service (Medical): No    Lack of Transportation (Non-Medical): No  Recent Concern:  Transportation Needs - Unmet Transportation Needs (01/24/2024)   PRAPARE - Transportation    Lack of Transportation (Medical): Yes    Lack of Transportation (Non-Medical): Yes  Physical Activity: Insufficiently Active (09/23/2023)   Exercise Vital Sign    Days of Exercise per Week: 2 days    Minutes of Exercise per Session: 20 min  Stress: Stress Concern Present (09/23/2023)   [employer] of Occupational Health - Occupational Stress Questionnaire    Feeling of Stress : Rather much  Social Connections: Moderately Isolated (09/23/2023)   Social Connection and Isolation Panel    Frequency of Communication with Friends and Family: More than three times a week    Frequency of Social Gatherings with Friends and Family: Twice a week    Attends Religious Services: 1 to 4 times per year    Active Member of [employer] or Organizations: No    Attends Banker Meetings: Never    Marital Status: Never married    Hospital Course:   During the patient's hospitalization, patient had extensive initial psychiatric evaluation, and follow-up psychiatric evaluations every day.   Psychiatric diagnoses provided upon initial assessment: MDD, gender dysphoria,DID, anxiety, and ADHD   Patient's psychiatric medications were adjusted on admission: Started Prozac  10 mg and Risperdal  0.5 mg   During the hospitalization, other adjustments were made to the patient's psychiatric medication regimen: Prozac  titrated from 10 mg to 20 mg for continued depression symptoms   Gradually, patient started adjusting to milieu.   Patient's care was discussed during the interdisciplinary team meeting every day during the hospitalization.   The patient denied having side effects to prescribed psychiatric medication.   The patient reports their target psychiatric symptoms of depression and psychosis responded well to the psychiatric medications, and the patient reports overall benefit other psychiatric hospitalization.  Supportive psychotherapy was provided to the patient. The patient also participated in regular group therapy while admitted.    Labs were reviewed with the patient, and abnormal results were discussed with the patient.   The patient denied having suicidal thoughts more than 48 hours prior to discharge.  Patient denies having homicidal thoughts.  Patient denies having auditory hallucinations.  Patient denies any visual hallucinations.  Patient denies having paranoid thoughts.   The patient is able to verbalize their individual safety plan to this provider.   It is recommended to the patient to continue psychiatric medications as prescribed, after discharge from the hospital.     It is recommended to the patient to follow up with your outpatient psychiatric provider and PCP.   Discussed with the patient, the impact of alcohol, drugs, tobacco have been there overall psychiatric and medical wellbeing, and total abstinence from substance use was recommended the patient.  On day of discharge patient reports they slept good last night.  They report their appetite is doing good.  They report no SI or HI.  They state their AVH is at baseline and not distressing, stating just the normal hallucinations-they are comforting.  They report no paranoia or ideas of reference.  They report no issues with her medications.      Physical Findings: AIMS:  , ,  ,  ,  ,  ,   CIWA:    COWS:     Musculoskeletal: Strength & Muscle Tone: within normal limits Gait & Station: normal Patient leans: N/A   Psychiatric Specialty Exam:  Presentation  General Appearance:  Casual  Eye Contact: Good  Speech: Clear and Coherent  Speech Volume: Normal  Handedness: Right   Mood and Affect  Mood: -- (I am feeling much better)  Affect: Congruent   Thought Process  Thought Processes: Coherent  Descriptions of Associations:Circumstantial  Orientation:Full (Time, Place and Person)  Thought  Content:WDL  History of Schizophrenia/Schizoaffective disorder:Yes  Duration of Psychotic Symptoms:Less than six months  Hallucinations:Hallucinations: Auditory; Visual Description of Auditory Hallucinations: Just the normal voices Description of Visual Hallucinations: Just the normal hallucinations, its comforting and not distressing  Ideas of Reference:None  Suicidal Thoughts:Suicidal Thoughts: No  Homicidal Thoughts:Homicidal Thoughts: No   Sensorium  Memory: Immediate Good; Recent Good  Judgment: Good  Insight: Good   Executive Functions  Concentration: Good  Attention Span: Good  Recall: Good  Fund of Knowledge: Good  Language: Good   Psychomotor Activity  Psychomotor Activity: Psychomotor Activity: Normal   Assets  Assets: Desire for Improvement; Housing; Resilience; Social Support   Sleep  Sleep: Sleep: Good  Estimated Sleeping Duration (Last 24 Hours): 4.25-6.00 hours   Physical Exam: Physical Exam Vitals and nursing note reviewed.  Constitutional:      General: Evelyn Owens is not in acute distress.    Appearance: Normal appearance. [name] is normal weight. [name] is not ill-appearing or toxic-appearing.  Pulmonary:     Effort: Pulmonary effort is normal.  Neurological:     General: No focal deficit present.    Review of Systems  Gastrointestinal:  Negative for abdominal pain, constipation, diarrhea, nausea and vomiting.  Neurological:  Negative for dizziness and headaches.  Psychiatric/Behavioral:  Positive for hallucinations. Negative for suicidal ideas. The patient is not nervous/anxious and does not have insomnia.    Blood pressure 120/84, pulse 98, temperature (!) 97.5 F (36.4 C), temperature source Oral, resp. rate 16, height 5' 3 (1.6 m), weight 48.1 kg, last menstrual period 01/06/2024, SpO2 100%. Body mass index is 18.78 kg/m.   Social History   Tobacco Use  Smoking Status Every Day   Types: Cigarettes    Passive exposure: Yes  Smokeless Tobacco Never   Tobacco Cessation:  N/A, patient does not currently use tobacco products   Blood Alcohol level:  Lab Results  Component Value Date   ETH <10 07/27/2022   ETH <10 07/15/2017    Metabolic Disorder Labs:  Lab Results  Component Value Date   HGBA1C 4.6 (L) 01/24/2024   MPG 85.32 01/24/2024   MPG 105.41 07/27/2022   Lab Results  Component Value Date   PROLACTIN 29.7 (H) 12/18/2018   Lab Results  Component Value Date   CHOL 144 01/24/2024   TRIG 58 01/24/2024   HDL 59 01/24/2024   CHOLHDL 2.4 01/24/2024   VLDL 12 01/24/2024   LDLCALC 73 01/24/2024   LDLCALC 67 07/27/2022    See Psychiatric Specialty Exam and Suicide Risk Assessment completed by Attending Physician prior to discharge.  Discharge destination:  Home  Is patient on multiple antipsychotic therapies at discharge:  No   Has Patient had three or more failed trials of antipsychotic monotherapy by history:  No  Recommended Plan for Multiple Antipsychotic Therapies: NA  Discharge Instructions     Diet - low sodium heart healthy   Complete by: As directed    Increase activity slowly   Complete by: As directed       Allergies as of 01/28/2024       Reactions   Milk-related Compounds Diarrhea, Nausea And Vomiting, Other (See Comments)   Bad gas   Shellfish Allergy Hives        Medication List     STOP taking these medications    sertraline  50 MG tablet Commonly known as: Zoloft    Slynd  4 MG Tabs Generic drug: Drospirenone        TAKE these medications      Indication  FLUoxetine  20 MG capsule Commonly known as: PROZAC  Take 1 capsule (20 mg total) by mouth daily.  Indication: Depression   ibuprofen  200 MG tablet Commonly known as: ADVIL  Take 200 mg by mouth every 6 (six) hours as needed for moderate pain (pain score 4-6), headache or mild pain (pain score 1-3).  Indication: Pain   risperiDONE  0.5 MG tablet Commonly known as:  RISPERDAL  Take 1 tablet (0.5 mg total) by mouth 2 (two) times daily.  Indication: Schizophrenia   traZODone  50 MG tablet Commonly known as: DESYREL  Take 0.5 tablets (25 mg total) by mouth at bedtime as needed for sleep.  Indication: Trouble Sleeping        Follow-up Information     [facility] Follow up on 02/11/2024.   Specialty: Behavioral Health Why: You have an appointment for medication management services on 02/11/24 at 1:00 pm.  You also have an appointment for therapy services on 03/31/24 at 3:00 pm. Contact information: [address]   [phone]        [facility] Call.   Why: You have been referred to this provider for therapy services. This office will call you post-discharge if able to accept you for truama specific therapy services. Contact information: [address] [place] [address] [phone]                 Follow-up recommendations/ Comments:  Activity: as tolerated   Diet: heart healthy   Other: -Follow-up with your outpatient psychiatric provider -instructions on appointment date, time, and address (location) are provided to you in discharge paperwork.   -Take your psychiatric medications as prescribed at discharge - instructions are provided to you in the discharge paperwork   -Follow-up with outpatient primary care doctor and other specialists -for management of chronic medical disease, including: N/A   -Testing: Follow-up with outpatient provider for abnormal lab results: N/A   -Recommend abstinence from alcohol, tobacco, and other illicit drug use at discharge.    -If your psychiatric symptoms recur, worsen, or if you have side effects to your psychiatric medications, call your outpatient psychiatric provider, 911, [phone] or go to the nearest emergency department.   -If suicidal thoughts recur, call your outpatient psychiatric provider, 911, [phone]  or go to the nearest emergency department.  Signed: Alan Maiden, MD 01/28/2024, 11:56 AM

## 2024-01-28 NOTE — Progress Notes (Signed)
 [facility] Adult Case Management Discharge Plan :  Will you be returning to the same living situation after discharge:  Yes,  home  At discharge, do you have transportation home?: Yes,  CSW provided via [employer] Do you have the ability to pay for your medications: Yes,  active Medicaid  Release of information consent forms completed and in the chart;  Patient's signature needed at discharge.  Patient to Follow up at:  Follow-up Information     [facility] Follow up on 02/11/2024.   Specialty: Behavioral Health Why: You have an appointment for medication management services on 02/11/24 at 1:00 pm.  You also have an appointment for therapy services on 03/31/24 at 3:00 pm. Contact information: [address] [place]  [address] [phone]        [facility] Call.   Why: You have been referred to this provider for therapy services. This office will call you post-discharge if able to accept you for truama specific therapy services. Contact information: [address] [place] [address] [phone]                 Next level of care provider has access to [facility] Link:no  Safety Planning and Suicide Prevention discussed: Yes,  completed w/ Pt's father  Has patient been referred to the Quitline?: Patient refused referral for treatment  Patient has been referred for addiction treatment: No known substance use disorder.  Bronson Bressman N Ramata Strothman, LCSW 01/28/2024, 2:41 PM

## 2024-01-28 NOTE — Progress Notes (Signed)
 Evelyn Owens  D/C'd Home per MD order.  Discussed with the patient and all questions fully answered.   An After Visit Summary was printed and given to the patient. Patient received prescription.  D/c education completed with patient  including follow up instructions, medication list, d/c activities limitations if indicated, with other d/c instructions as indicated by MD - patient able to verbalize understanding, all questions fully answered.   Patient instructed to return to ED, call 911, or call MD for any changes in condition.   Patient escorted to the main entrance, and D/C home via taxi.  [name] Doing 01/28/2024 12:45 PM

## 2024-02-11 ENCOUNTER — Ambulatory Visit (INDEPENDENT_AMBULATORY_CARE_PROVIDER_SITE_OTHER): Payer: MEDICAID | Admitting: Psychiatry

## 2024-02-11 ENCOUNTER — Encounter (HOSPITAL_COMMUNITY): Payer: Self-pay | Admitting: Psychiatry

## 2024-02-11 ENCOUNTER — Other Ambulatory Visit: Payer: Self-pay | Admitting: Obstetrics and Gynecology

## 2024-02-11 VITALS — BP 120/84 | HR 90 | Ht 63.0 in | Wt 106.0 lb

## 2024-02-11 DIAGNOSIS — Z3041 Encounter for surveillance of contraceptive pills: Secondary | ICD-10-CM

## 2024-02-11 DIAGNOSIS — Z0000 Encounter for general adult medical examination without abnormal findings: Secondary | ICD-10-CM

## 2024-02-11 DIAGNOSIS — F4310 Post-traumatic stress disorder, unspecified: Secondary | ICD-10-CM

## 2024-02-11 DIAGNOSIS — F331 Major depressive disorder, recurrent, moderate: Secondary | ICD-10-CM | POA: Diagnosis not present

## 2024-02-11 MED ORDER — TRAZODONE HCL 50 MG PO TABS: 25.0000 mg | ORAL_TABLET | Freq: Every evening | ORAL | 0 refills | Status: AC | PRN

## 2024-02-11 MED ORDER — FLUOXETINE HCL 40 MG PO CAPS: 40.0000 mg | ORAL_CAPSULE | Freq: Every day | ORAL | 0 refills | Status: DC

## 2024-02-11 MED ORDER — OLANZAPINE 5 MG PO TBDP: 5.0000 mg | ORAL_TABLET | Freq: Every day | ORAL | 0 refills | Status: AC

## 2024-02-11 NOTE — Progress Notes (Signed)
 Psychiatric Initial Adult Assessment   Patient Identification: Evelyn Owens MRN:  [id number] Date of Evaluation:  02/11/2024 Referral Source: [facility] inpatient Chief Complaint:   Chief Complaint  Patient presents with   Anxiety   Depression   Follow-up    Follow up from inpatient, [facility] ACT team to connect and establish care per patient    Visit Diagnosis:    ICD-10-CM   1. PTSD (post-traumatic stress disorder)  F43.10     2. Major depressive disorder, recurrent episode, moderate (HCC)  F33.1       History of Present Illness:   18 yo client presenting for follow up care after inpatient hospitalization in July, discharged on 01/28/24.  History of MDD, ADHD, anxiety, PTSD, and panic attacks.  At some point, someone added DID to the diagnosis list.  The client was very anxious at the beginning of the assessment as she reported lactating starting this morning and it is causing much distress, most likely due to the Risperdal  that was started inpatient, especially since the patient reported taking three pregnancies tests and all were negative.  Risperdal  0.5 mg BID changed to daily for three days and discontinue while returning to her self-reported Zyprexa  5 mg PRN with recommendation to take it every night by this provider.  Denies paranoia, pretty happy that's gone, and VH and AH are gone.  She realized her auditory hallucinations were not real as I only heard them from one ear and they are gone.  Not responding to internal on assessment either.  After her medication questions was resolved with the lactating, she requested to restart her ADHD medication since she is returning to high school shortly.  When asked about testing, she does not have official testing, last prescribed in 2023.  Provided her with information for testing and if she tested positive to save the results for future providers and accommodations at school.  She rated her depression as a 6/10 with not well at motivation  to complete chores at home or get ready for school, energy is improving.  Denied hopeless and worthless feelings.  No suicidal ideations, 16-18 suicide attempts per the client with 6 psychiatric admissions.  She is confidentially nah to suicidal thoughts of any kind.  Denies access to guns.  History of cutting/hitting/biting since May.  5/10 anxiety with 4-5 panic attacks a week that are episodic, clusters with heavy breathing, feeling like she is having a heart attacks, and emotionally shutting down.  Trauma with flashbacks with triggers.  Sleep is too much and request to reduce her Trazodone  from 50 mg PRN to 25 mg.  Appetite is good with no weight changes that she is aware.  Denies symptoms of mania with the decrease need for sleep and increase in activity, energy, distractions, and grandiose feelings.  Vapes occasionally and stopped cannabis since July, smokes a cigarette daily at times.  She reported excessive caffeine use until recently and reduced it to 160 mg, 2 cups of coffee).    She requested to be tested for antisocial personality d/o and explained this would need to be evaluated with a MMPI with a therapist.  According to the client, she was told by [facility] that she would be contacted by [facility] for an ACT team, number provided to follow up.  She lives with her father and will be returning to high school in a few weeks.  Engaged easily in conversation with plans to go get coffee with her friend after the session.  Dressed in black with  multiple pierces on her face with black, wet hair as she just showered.  Positive and upbeat on presentation with future planning and goals to finish high school and continue working on her mental health.  Associated Signs/Symptoms: Depression Symptoms:  depressed mood, difficulty concentrating, (Hypo) Manic Symptoms:  none Anxiety Symptoms:  Excessive Worry, Panic Symptoms, Psychotic Symptoms:  none PTSD Symptoms: Re-experiencing:  Flashbacks  Past  Psychiatric History: multiple hospitalizations and diagnoses  Previous Psychotropic Medications: Yes   Substance Abuse History in the last 12 months:  No.  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   Gender dysphoria 2019   Lactose intolerance     Past Surgical History:  Procedure Laterality Date   MOUTH SURGERY  2023    Family Psychiatric History: mother with BPD per patient and father with ASD, ADHD  Family History:  Family History  Problem Relation Age of Onset   Arthritis Mother    Lupus Mother    Graves' disease Mother    Rheum arthritis Mother    Arthritis Father     Social History:   Social History   Socioeconomic History   Marital status: Single    Spouse name: Not on file   Number of children: Not on file   Years of education: Not on file   Highest education level: Not on file  Occupational History   Not on file  Tobacco Use   Smoking status: Every Day    Types: Cigarettes    Passive exposure: Yes   Smokeless tobacco: Never  Vaping Use   Vaping status: Former  Substance and Sexual Activity   Alcohol use: No   Drug use: Not Currently    Types: Marijuana   Sexual activity: Yes    Partners: Male    Birth control/protection: Condom, Pill    Comment: polyamorous  Other Topics Concern   Not on file  Social History Narrative   Not on file   Social Drivers of Health   Financial Resource Strain: High Risk (09/23/2023)   Overall Financial Resource Strain (CARDIA)    Difficulty of Paying Living Expenses: Very hard  Food Insecurity: Patient Declined (01/24/2024)   Hunger Vital Sign    Worried About Running Out of Food in the Last Year: Patient declined    Ran Out of Food in the Last Year: Patient declined  Recent Concern: Food Insecurity - Food Insecurity  Present (01/24/2024)   Hunger Vital Sign    Worried About Running Out of Food in the Last Year: Sometimes true    Ran Out of Food in the Last Year: Sometimes true  Transportation Needs: No Transportation Needs (01/24/2024)   PRAPARE - Administrator, Civil Service (Medical): No    Lack of Transportation (Non-Medical): No  Recent Concern: Transportation Needs - Unmet Transportation Needs (01/24/2024)   PRAPARE - Transportation    Lack of Transportation (Medical): Yes    Lack of Transportation (Non-Medical): Yes  Physical Activity: Insufficiently Active (09/23/2023)   Exercise Vital Sign    Days of Exercise per Week: 2 days    Minutes of Exercise per Session: 20 min  Stress: Stress Concern Present (09/23/2023)   [employer] of Occupational Health - Occupational Stress Questionnaire    Feeling of Stress : Rather much  Social Connections: Moderately Isolated (09/23/2023)   Social Connection and Isolation Panel    Frequency of Communication with Friends and Family: More than three times a week    Frequency of Social Gatherings with Friends and Family: Twice a week    Attends Religious Services: 1 to 4 times per year    Active Member of [employer] or Organizations: No    Attends Banker Meetings: Never    Marital Status: Never married    Additional Social History: lives with her father and returning to high school in August  Allergies:   Allergies  Allergen Reactions   Milk-Related Compounds Diarrhea, Nausea And Vomiting and Other (See Comments)    Bad gas   Shellfish Allergy Hives    Metabolic Disorder Labs: Lab Results  Component Value Date   HGBA1C 4.6 (L) 01/24/2024   MPG 85.32 01/24/2024   MPG 105.41 07/27/2022   Lab Results  Component Value Date   PROLACTIN 29.7 (H) 12/18/2018   Lab Results  Component Value Date   CHOL 144 01/24/2024   TRIG 58 01/24/2024   HDL 59 01/24/2024   CHOLHDL 2.4 01/24/2024   VLDL 12 01/24/2024   LDLCALC 73  01/24/2024   LDLCALC 67 07/27/2022   Lab Results  Component Value Date   TSH 1.959 01/24/2024    Therapeutic Level Labs: No results found for: LITHIUM No results found for: CBMZ No results found for: VALPROATE  Current Medications: Current Outpatient Medications  Medication Sig Dispense Refill   FLUoxetine  (PROZAC ) 40 MG capsule Take 1 capsule (40 mg total) by mouth daily. 30 capsule 0   OLANZapine  zydis (ZYPREXA ) 5 MG disintegrating tablet Take 1 tablet (5 mg total) by mouth at bedtime. 30 tablet 0   traZODone  (DESYREL ) 50 MG tablet Take 0.5 tablets (25 mg total) by mouth at bedtime as needed for sleep. 30 tablet 0   No current facility-administered medications for this visit.    Musculoskeletal: Strength & Muscle Tone: within normal limits Gait & Station: normal Patient leans: N/A  Psychiatric Specialty Exam: Review of Systems  Psychiatric/Behavioral:  Positive for dysphoric mood. The patient is nervous/anxious.   All other systems reviewed and are negative.   There were no vitals taken for this visit.There is no height or weight on file to calculate BMI.  General Appearance: Casual  Eye Contact:  Fair  Speech:  Clear and Coherent  Volume:  Normal  Mood:  Anxious and Depressed  Affect:  Congruent  Thought Process:  Coherent  Orientation:  Full (Time, Place, and Person)  Thought Content:  Logical  Suicidal Thoughts:  No  Homicidal Thoughts:  No  Memory:  Immediate;   Good Recent;   Good Remote;   Good  Judgement:  Fair  Insight:  Fair  Psychomotor Activity:  Increased  Concentration:  Concentration: Fair and Attention Span: Fair  Recall:  Good  Fund of Knowledge:Good  Language: Good  Akathisia:  No  Handed:  Right  AIMS (if indicated):  not done  Assets:  Housing Leisure Time Physical Health Resilience Social Support Vocational/Educational  ADL's:  Intact  Cognition: WNL  Sleep:  Good   Screenings: AIMS    Flowsheet Row [facility]) [facility] Admission (Discharged) from OP [facility] Admission (Discharged) from OP [facility]  AIMS Total Score 0 0 0   AUDIT  Flowsheet Row [facility]) [facility]  Alcohol Use Disorder Identification Test Final Score (AUDIT) 1   GAD-7    Flowsheet Row Office Visit from 10/04/2023 in [facility] Counselor from 09/23/2023 in [facility] Office Visit from 06/20/2023 in [facility] for [facility] at [place] Office Visit from 11/09/2022 in [facility] Office Visit from 05/16/2022 in [facility] for [facility] at [place]  Total GAD-7 Score 15 7 4 14 7    PHQ2-9    Flowsheet Row Office Visit from 10/04/2023 in [facility] Counselor from 09/23/2023 in [facility] Office Visit from 06/20/2023 in [facility] for [facility] at [place] Office Visit from 11/09/2022 in [facility] Office Visit from 05/16/2022 in [facility] Total Score 3 3 0 3 1  PHQ-9 Total Score 10 12 2 16 3    Flowsheet Row Ad[facility] ([facility]charged) [facility] Most recent reading at 01/24/2024 10:53 PM ED from 01/24/2024 in [facility] Most recent reading at 01/24/2024  3:12 AM ED from 01/06/2024 in [facility] Most recent reading at 01/06/2024  5:53 AM  C-SSRS RISK CATEGORY High Risk High Risk No Risk    Assessment and Plan:  Major depressive disorder, recurrent, moderate: Prozac  20 mg daily increased to 40 mg  daily Risperdal  0.5 mg BID reduced to daily for three days and then discontinue, started inpatient discharged 01/28/24 Restarted her Zyprexa  5 mg at bedtime which she prefers PRN, encouraged to take it daily  General anxiety disorder: Prozac  40 mg daily started vs 20 mg daily  Insomnia: Decreased Trazodone  50 mg at bedtime PRN to 25 mg at bedtime PRN sleep  Collaboration of Care: Medication Management AEB Sharlot Becker, NP managing medications.  Follow up with [facility] for the ACT team   Patient/Guardian was advised Release of Information must be obtained prior to any record release in order to collaborate their care with an outside provider. Patient/Guardian was advised if they have not already done so to contact the registration department to sign all necessary forms in order for us  to release information regarding their care.   Consent: Patient/Guardian gives verbal consent for treatment and assignment of benefits for services provided during this visit. Patient/Guardian expressed understanding and agreed to proceed.   Sharlot Becker, NP 8/12/20252:56 PM

## 2024-02-26 ENCOUNTER — Encounter: Payer: Self-pay | Admitting: Physician Assistant

## 2024-03-31 ENCOUNTER — Ambulatory Visit (HOSPITAL_COMMUNITY): Payer: MEDICAID | Admitting: Licensed Clinical Social Worker

## 2024-05-12 ENCOUNTER — Ambulatory Visit: Payer: MEDICAID | Admitting: Advanced Practice Midwife

## 2024-05-25 ENCOUNTER — Other Ambulatory Visit (HOSPITAL_COMMUNITY): Payer: Self-pay | Admitting: Psychiatry

## 2024-06-02 ENCOUNTER — Other Ambulatory Visit: Payer: Self-pay

## 2024-06-02 DIAGNOSIS — Z3041 Encounter for surveillance of contraceptive pills: Secondary | ICD-10-CM

## 2024-06-02 DIAGNOSIS — Z0000 Encounter for general adult medical examination without abnormal findings: Secondary | ICD-10-CM

## 2024-06-02 MED ORDER — SLYND 4 MG PO TABS: 1.0000 | ORAL_TABLET | Freq: Every day | ORAL | 0 refills | Status: AC

## 2024-06-04 ENCOUNTER — Ambulatory Visit: Payer: MEDICAID | Admitting: Family Medicine
